# Patient Record
Sex: Female | Born: 1970
Health system: Southern US, Community
[De-identification: ages and names within clinical notes are randomized; demographics above are authoritative.]

## PROBLEM LIST (undated history)

## (undated) DIAGNOSIS — L409 Psoriasis, unspecified: Secondary | ICD-10-CM

## (undated) DIAGNOSIS — I10 Essential (primary) hypertension: Secondary | ICD-10-CM

## (undated) DIAGNOSIS — T7840XA Allergy, unspecified, initial encounter: Secondary | ICD-10-CM

## (undated) DIAGNOSIS — F329 Major depressive disorder, single episode, unspecified: Secondary | ICD-10-CM

## (undated) DIAGNOSIS — F32A Depression, unspecified: Secondary | ICD-10-CM

## (undated) DIAGNOSIS — J301 Allergic rhinitis due to pollen: Secondary | ICD-10-CM

## (undated) DIAGNOSIS — J439 Emphysema, unspecified: Secondary | ICD-10-CM

## (undated) DIAGNOSIS — F419 Anxiety disorder, unspecified: Secondary | ICD-10-CM

## (undated) HISTORY — DX: Emphysema, unspecified: J43.9

## (undated) HISTORY — DX: Anxiety disorder, unspecified: F41.9

## (undated) HISTORY — DX: Depression, unspecified: F32.A

## (undated) HISTORY — PX: WISDOM TOOTH EXTRACTION: SHX21

## (undated) HISTORY — DX: Allergic rhinitis due to pollen: J30.1

## (undated) HISTORY — DX: Psoriasis, unspecified: L40.9

## (undated) HISTORY — DX: Major depressive disorder, single episode, unspecified: F32.9

## (undated) HISTORY — DX: Allergy, unspecified, initial encounter: T78.40XA

## (undated) HISTORY — DX: Essential (primary) hypertension: I10

---
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
## 2001-07-07 ENCOUNTER — Emergency Department (HOSPITAL_COMMUNITY): Admission: EM | Admit: 2001-07-07 | Discharge: 2001-07-07 | Payer: Self-pay | Admitting: Emergency Medicine

## 2001-07-24 ENCOUNTER — Other Ambulatory Visit: Admission: RE | Admit: 2001-07-24 | Discharge: 2001-07-24 | Payer: Self-pay | Admitting: Gynecology

## 2016-02-25 ENCOUNTER — Telehealth: Payer: Self-pay

## 2016-02-25 DIAGNOSIS — Z0120 Encounter for dental examination and cleaning without abnormal findings: Secondary | ICD-10-CM | POA: Diagnosis not present

## 2016-02-25 NOTE — Telephone Encounter (Signed)
Called patient,left message for return call to update chart.

## 2016-02-26 ENCOUNTER — Encounter: Payer: Self-pay | Admitting: Family Medicine

## 2016-02-26 ENCOUNTER — Ambulatory Visit (HOSPITAL_BASED_OUTPATIENT_CLINIC_OR_DEPARTMENT_OTHER)
Admission: RE | Admit: 2016-02-26 | Discharge: 2016-02-26 | Disposition: A | Discharge status: Home / Self Care | Payer: 59 | Source: Ambulatory Visit | Attending: Family Medicine | Admitting: Family Medicine

## 2016-02-26 ENCOUNTER — Ambulatory Visit (INDEPENDENT_AMBULATORY_CARE_PROVIDER_SITE_OTHER): Payer: 59 | Admitting: Family Medicine

## 2016-02-26 VITALS — BP 112/88 | HR 88 | Temp 97.7°F | Ht 66.0 in | Wt 323.4 lb

## 2016-02-26 DIAGNOSIS — Z131 Encounter for screening for diabetes mellitus: Secondary | ICD-10-CM | POA: Diagnosis not present

## 2016-02-26 DIAGNOSIS — G4726 Circadian rhythm sleep disorder, shift work type: Secondary | ICD-10-CM

## 2016-02-26 DIAGNOSIS — Z130 Encounter for screening for diseases of the blood and blood-forming organs and certain disorders involving the immune mechanism: Secondary | ICD-10-CM | POA: Diagnosis not present

## 2016-02-26 DIAGNOSIS — D72829 Elevated white blood cell count, unspecified: Secondary | ICD-10-CM

## 2016-02-26 DIAGNOSIS — M47896 Other spondylosis, lumbar region: Secondary | ICD-10-CM | POA: Diagnosis not present

## 2016-02-26 DIAGNOSIS — E669 Obesity, unspecified: Secondary | ICD-10-CM | POA: Diagnosis not present

## 2016-02-26 DIAGNOSIS — M5441 Lumbago with sciatica, right side: Secondary | ICD-10-CM

## 2016-02-26 DIAGNOSIS — M545 Low back pain: Secondary | ICD-10-CM | POA: Diagnosis present

## 2016-02-26 DIAGNOSIS — M47816 Spondylosis without myelopathy or radiculopathy, lumbar region: Secondary | ICD-10-CM | POA: Diagnosis not present

## 2016-02-26 DIAGNOSIS — Z13220 Encounter for screening for lipoid disorders: Secondary | ICD-10-CM

## 2016-02-26 DIAGNOSIS — Z7689 Persons encountering health services in other specified circumstances: Secondary | ICD-10-CM

## 2016-02-26 DIAGNOSIS — Z7189 Other specified counseling: Secondary | ICD-10-CM

## 2016-02-26 LAB — COMPREHENSIVE METABOLIC PANEL
ALK PHOS: 108 U/L (ref 39–117)
ALT: 21 U/L (ref 0–35)
AST: 16 U/L (ref 0–37)
Albumin: 3.9 g/dL (ref 3.5–5.2)
BILIRUBIN TOTAL: 0.5 mg/dL (ref 0.2–1.2)
BUN: 12 mg/dL (ref 6–23)
CALCIUM: 9.3 mg/dL (ref 8.4–10.5)
CO2: 29 meq/L (ref 19–32)
CREATININE: 0.64 mg/dL (ref 0.40–1.20)
Chloride: 103 mEq/L (ref 96–112)
GFR: 106.49 mL/min (ref >60.00)
GLUCOSE: 114 mg/dL — AB (ref 70–99)
Potassium: 4.3 mEq/L (ref 3.5–5.1)
Sodium: 140 mEq/L (ref 135–145)
TOTAL PROTEIN: 7.5 g/dL (ref 6.0–8.3)

## 2016-02-26 LAB — CBC
HCT: 43.1 % (ref 36.0–46.0)
HEMOGLOBIN: 14.4 g/dL (ref 12.0–15.0)
MCHC: 33.5 g/dL (ref 30.0–36.0)
MCV: 89.3 fl (ref 78.0–100.0)
PLATELETS: 406 10*3/uL — AB (ref 150.0–400.0)
RBC: 4.82 Mil/uL (ref 3.87–5.11)
RDW: 12.8 % (ref 11.5–15.5)
WBC: 12.2 10*3/uL — AB (ref 4.0–10.5)

## 2016-02-26 LAB — HEMOGLOBIN A1C: Hgb A1c MFr Bld: 5.5 % (ref 4.6–6.5)

## 2016-02-26 LAB — LIPID PANEL
CHOL/HDL RATIO: 3
Cholesterol: 168 mg/dL (ref 0–200)
HDL: 52 mg/dL (ref >39.00)
LDL Cholesterol: 99 mg/dL (ref 0–99)
NONHDL: 116.25
TRIGLYCERIDES: 88 mg/dL (ref 0.0–149.0)
VLDL: 17.6 mg/dL (ref 0.0–40.0)

## 2016-02-26 MED ORDER — TEMAZEPAM 30 MG PO CAPS: 30.0000 mg | ORAL_CAPSULE | Freq: Every day | ORAL | Status: DC

## 2016-02-26 MED ORDER — HYDROCODONE-ACETAMINOPHEN 5-325 MG PO TABS: 1.0000 | ORAL_TABLET | Freq: Every evening | ORAL | Status: DC | PRN

## 2016-02-26 MED ORDER — TRAZODONE HCL 50 MG PO TABS: 50.0000 mg | ORAL_TABLET | Freq: Every day | ORAL | Status: DC

## 2016-02-26 NOTE — Progress Notes (Addendum)
[place] at [facility] [address] [place], [place] [address] [phone]  Date:  02/26/2016   Name:  Leslie Wiggins   DOB:  10-Mar-1971   MRN:  [id number]  PCP:  Lamar Blinks, MD    Chief Complaint: New Patient (Initial Visit)   History of Present Illness:  Leslie Wiggins is a 45 y.o. very pleasant female patient who presents with the following:  Recently moved to [facility] from [place] and wishes to establish care with [place] today.  She and her mother moved here together- they live together as her mother is disabled and needs help. The weather was also a major factor in their decision to move.   She lives near [place] and our office is convenient for her  History of obesity, "GI issues" that she controls with diet (she cannot eat fatty foods), back pain Surgical history: wisdom teeth, right ankle fracture that was surgically repaired in her teens  No recent mammo- she would like to get this scheduled.   She does have a history of prolonged menstrual bleeding but this has not happened to her in about 3 years. Her menses are every 1-2 months but this is not unusual for her. 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 She also uses restoril when she has to sleep during the day in preparation for a night shift.    She also notes that she has chronic back and right foot pain for which she uses vicodin once a day.  She did use xanax for stress at her last job but she does not use this much any longer.   She has not had an MRI of her back to investigate her pain.  She has noted the right leg pain for about 4-5 years.  She just started using the narcotic pain medication when she packed to move from [place]- the increased exercise increased her pain.Marland Kitchen She will also have numbness in her middle toes mostly at night.   She also has some urge incontinence-  she has noted this for 9 months or so.  Never had any back surgery or imaging of her back  She is swimming for exercise, but unfortunately her new job is much less active which causes more back pain.  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    She had just some jelly beans so far today.   Patient Active Problem List   Diagnosis Date Noted  . Obesity 02/26/2016    No past medical history on file.  No past surgical history on file.  Social History  Substance Use Topics  . Smoking status: Not on file  . Smokeless tobacco: Not on file  . Alcohol Use: Not on file    No family history on file.  Allergies  Allergen Reactions  . Pollen Extract     Medication list has been reviewed and updated.  No current outpatient prescriptions on file prior to visit.   No current facility-administered medications on file prior to visit.    Review of Systems:  As per HPI- otherwise negative. LMP was 3 weeks ago, no recent intercourse    Physical Examination: Filed Vitals:   02/26/16 0844  BP: 112/88  Pulse:  88  Temp: 97.7 F (36.5 C)   Filed Vitals:   02/26/16 0844  Height: 5\' 6"  (1.676 m)  Weight: 323 lb 6.4 oz (146.693 kg)   Body mass index is 52.22 kg/(m^2). Ideal Body Weight: Weight in (lb) to have BMI = 25: 154.6  GEN: WDWN, NAD, Non-toxic, A & O x 3, morbid obesity HEENT: Atraumatic, Normocephalic. Neck supple. No masses, No LAD. Ears and Nose: No external deformity. CV: RRR, No M/G/R. No JVD. No thrill. No extra heart sounds. PULM: CTA B, no wheezes, crackles, rhonchi. No retractions. No resp. distress. No accessory muscle use. EXTR: No c/c/e NEURO Normal gait.  Normal lumbar flexion and extension. She notes tenderness in the lower lumbar spine to palpation, more so on the right. She notes pain with SLR on the right only. Reduced DTR at bilateral  patella and achilles.  Normal strength of BLE PSYCH: Normally interactive. Conversant. Not depressed or anxious appearing.  Calm demeanor.    Assessment and Plan: Right-sided low back pain with right-sided sciatica - Plan: HYDROcodone-acetaminophen (NORCO) 5-325 MG tablet, DG Lumbar Spine Complete, MR Lumbar Spine Wo Contrast  Obesity  Screening for diabetes mellitus - Plan: Comprehensive metabolic panel, Hemoglobin A1c  Screening for hyperlipidemia - Plan: Lipid panel  Shift work sleep disorder - Plan: traZODone (DESYREL) 50 MG tablet, temazepam (RESTORIL) 30 MG capsule  Screening for deficiency anemia - Plan: CBC  Establishing care with new doctor, encounter for  Will check CMP, A1c to monitor her DM, trazodone and Restoril for shift work sleep issues Did give a limited supply of norco for her to use as needed for severe pain while back evaluation is ongoing.  To have labs and lumbar films today- plan for MRI unless films suggest otherwise   Signed Lamar Blinks, MD  Received x-ray report as below.  Will discuss with pt at the same time as her BW.  Will order MRI for her today Dg Lumbar Spine Complete  02/26/2016  CLINICAL DATA:  Low back pain, right leg pain for 1 month, no known injury EXAM: LUMBAR SPINE - COMPLETE 4+ VIEW COMPARISON:  None. FINDINGS: Five views of the lumbar spine submitted. No acute fracture or subluxation. Minimal disc space flattening with anterior spurring at L1-L2 level. There is moderate disc space flattening with mild anterior spurring and endplate sclerotic changes at L4-L5 level. Mild facet degenerative changes at L5 level. IMPRESSION: No acute fracture or subluxation. Degenerative changes as described above. Electronically Signed   By: Lahoma Crocker M.D.   On: 02/26/2016 09:51   Results for orders placed or performed in visit on 02/26/16  CBC  Result Value Ref Range   WBC 12.2 (H) 4.0 - 10.5 K/uL   RBC 4.82 3.87 - 5.11 Mil/uL   Platelets 406.0 (H)  150.0 - 400.0 K/uL   Hemoglobin 14.4 12.0 - 15.0 g/dL   HCT 43.1 36.0 - 46.0 %   MCV 89.3 78.0 - 100.0 fl   MCHC 33.5 30.0 - 36.0 g/dL   RDW 12.8 11.5 - 15.5 %  Comprehensive metabolic panel  Result Value Ref Range   Sodium 140 135 - 145 mEq/L   Potassium 4.3 3.5 - 5.1 mEq/L   Chloride 103 96 - 112 mEq/L   CO2 29 19 - 32 mEq/L   Glucose, Bld 114 (H) 70 - 99 mg/dL   BUN 12 6 - 23 mg/dL   Creatinine, Ser 0.64 0.40 - 1.20 mg/dL   Total Bilirubin 0.5 0.2 - 1.2 mg/dL  Alkaline Phosphatase 108 39 - 117 U/L   AST 16 0 - 37 U/L   ALT 21 0 - 35 U/L   Total Protein 7.5 6.0 - 8.3 g/dL   Albumin 3.9 3.5 - 5.2 g/dL   Calcium 9.3 8.4 - 10.5 mg/dL   GFR 106.49 >60.00 mL/min  Lipid panel  Result Value Ref Range   Cholesterol 168 0 - 200 mg/dL   Triglycerides 88.0 0.0 - 149.0 mg/dL   HDL 52.00 >39.00 mg/dL   VLDL 17.6 0.0 - 40.0 mg/dL   LDL Cholesterol 99 0 - 99 mg/dL   Total CHOL/HDL Ratio 3    NonHDL 116.25   Hemoglobin A1c  Result Value Ref Range   Hgb A1c MFr Bld 5.5 4.6 - 6.5 %   Called and LMOM- I will order MRI.  Labs are ok, will send a letter   Received her MRI 4/2 as below.  Called and LMOM- she does indeed have a mild disc herniation at L4/5.  This may be impinging on L4.  If she would like, po steroids or a referral to spine/ PM&R may be helpful.  Please let me know her preference and I will mail her a copy of report.  Left her office phone number  Dg Lumbar Spine Complete  02/26/2016  CLINICAL DATA:  Low back pain, right leg pain for 1 month, no known injury EXAM: LUMBAR SPINE - COMPLETE 4+ VIEW COMPARISON:  None. FINDINGS: Five views of the lumbar spine submitted. No acute fracture or subluxation. Minimal disc space flattening with anterior spurring at L1-L2 level. There is moderate disc space flattening with mild anterior spurring and endplate sclerotic changes at L4-L5 level. Mild facet degenerative changes at L5 level. IMPRESSION: No acute fracture or subluxation.  Degenerative changes as described above. Electronically Signed   By: Lahoma Crocker M.D.   On: 02/26/2016 09:51   Mr Lumbar Spine Wo Contrast  03/06/2016  CLINICAL DATA:  Right-sided low back pain and right leg pain, 9 months duration. EXAM: MRI LUMBAR SPINE WITHOUT CONTRAST TECHNIQUE: Multiplanar, multisequence MR imaging of the lumbar spine was performed. No intravenous contrast was administered. COMPARISON:  Radiography 02/26/2016 FINDINGS: No significant finding at L3-4 or above. The discs show mild degenerative desiccation but there is no herniation or stenosis. The distal cord and conus are normal with the conus tip at L1. L4-5: The disc is degenerated with loss of height and there is a shallow herniation with slight caudal down turning in the midline. This indents the thecal sac slightly but does not appear to cause neural compression. There is mild foraminal narrowing on the right without definite compression of the exiting L4 nerve root. L5-S1:  Normal interspace. IMPRESSION: Single level pathology at the L4-5 level. The disc is degenerated with loss of height. There is shallow disc herniation with slight caudal migration. This indents the thecal sac slightly but does not appear to cause gross neural compression. There is mild foraminal stenosis on the right because of osteophyte in disc material. Definite compression of the L4 nerve root is not demonstrated, but neural irritation could occur. Electronically Signed   By: Nelson Chimes M.D.   On: 03/06/2016 17:38

## 2016-02-26 NOTE — Patient Instructions (Signed)
It was very nice to meet you today!  Please do to lab for a blood draw and then downstairs for your films.  I will be in touch with your labs and x-ray report asap We will plan for an MRI once we have your plain films back Please schedule a mammo at your convenience- this can be done at the [facility] if you like [phone] Do work on weight loss- this will help with your back pain

## 2016-02-26 NOTE — Addendum Note (Signed)
Addended by: Lamar Blinks C on: 02/26/2016 01:34 PM   Modules accepted: Orders

## 2016-02-27 MED FILL — HYDROCODON-APAP 5-325: 5-325 | 30 days supply | Qty: 30 | Fill #0

## 2016-02-27 MED FILL — traZODone HCL 50 MG TABS: 50 | 90 days supply | Qty: 90 | Fill #0

## 2016-02-27 MED FILL — TEMAZEPAM 30 MG CAPSULE: 30 | 30 days supply | Qty: 30 | Fill #0

## 2016-03-01 ENCOUNTER — Other Ambulatory Visit: Payer: Self-pay | Admitting: Family Medicine

## 2016-03-01 DIAGNOSIS — Z1231 Encounter for screening mammogram for malignant neoplasm of breast: Secondary | ICD-10-CM

## 2016-03-04 ENCOUNTER — Ambulatory Visit (HOSPITAL_BASED_OUTPATIENT_CLINIC_OR_DEPARTMENT_OTHER): Payer: 59

## 2016-03-06 ENCOUNTER — Ambulatory Visit (HOSPITAL_BASED_OUTPATIENT_CLINIC_OR_DEPARTMENT_OTHER)
Admission: RE | Admit: 2016-03-06 | Discharge: 2016-03-06 | Disposition: A | Discharge status: Home / Self Care | Payer: 59 | Source: Ambulatory Visit | Attending: Family Medicine | Admitting: Family Medicine

## 2016-03-06 DIAGNOSIS — M5126 Other intervertebral disc displacement, lumbar region: Secondary | ICD-10-CM | POA: Diagnosis not present

## 2016-03-06 DIAGNOSIS — M545 Low back pain: Secondary | ICD-10-CM | POA: Diagnosis not present

## 2016-03-06 DIAGNOSIS — M2578 Osteophyte, vertebrae: Secondary | ICD-10-CM | POA: Diagnosis not present

## 2016-03-06 DIAGNOSIS — M5441 Lumbago with sciatica, right side: Secondary | ICD-10-CM | POA: Insufficient documentation

## 2016-03-06 DIAGNOSIS — M4806 Spinal stenosis, lumbar region: Secondary | ICD-10-CM | POA: Diagnosis not present

## 2016-03-06 DIAGNOSIS — M5136 Other intervertebral disc degeneration, lumbar region: Secondary | ICD-10-CM | POA: Diagnosis not present

## 2016-03-07 ENCOUNTER — Encounter: Payer: Self-pay | Admitting: Family Medicine

## 2016-03-08 ENCOUNTER — Telehealth: Payer: Self-pay | Admitting: Family Medicine

## 2016-03-08 DIAGNOSIS — M5136 Other intervertebral disc degeneration, lumbar region: Secondary | ICD-10-CM

## 2016-03-08 DIAGNOSIS — M5126 Other intervertebral disc displacement, lumbar region: Secondary | ICD-10-CM

## 2016-03-08 NOTE — Telephone Encounter (Signed)
Called and spoke with her- she is most interested in a possible epidural steroid injection.  I will refer her to Dr., Nelva Bush or Mina Marble

## 2016-03-08 NOTE — Telephone Encounter (Signed)
Pt called in because she says that she is returning PCP phone call.    CB: [phone]

## 2016-03-09 ENCOUNTER — Ambulatory Visit (HOSPITAL_BASED_OUTPATIENT_CLINIC_OR_DEPARTMENT_OTHER)
Admission: RE | Admit: 2016-03-09 | Discharge: 2016-03-09 | Disposition: A | Discharge status: Home / Self Care | Payer: 59 | Source: Ambulatory Visit | Attending: Family Medicine | Admitting: Family Medicine

## 2016-03-09 DIAGNOSIS — Z1231 Encounter for screening mammogram for malignant neoplasm of breast: Secondary | ICD-10-CM | POA: Insufficient documentation

## 2016-03-15 ENCOUNTER — Ambulatory Visit (INDEPENDENT_AMBULATORY_CARE_PROVIDER_SITE_OTHER): Payer: 59 | Admitting: Family Medicine

## 2016-03-15 ENCOUNTER — Encounter: Payer: Self-pay | Admitting: Family Medicine

## 2016-03-15 ENCOUNTER — Other Ambulatory Visit (HOSPITAL_COMMUNITY)
Admission: RE | Admit: 2016-03-15 | Discharge: 2016-03-15 | Disposition: A | Discharge status: Home / Self Care | Payer: 59 | Source: Ambulatory Visit | Attending: Family Medicine | Admitting: Family Medicine

## 2016-03-15 VITALS — BP 122/84 | HR 100 | Temp 98.0°F | Ht 66.0 in | Wt 322.8 lb

## 2016-03-15 DIAGNOSIS — Z01419 Encounter for gynecological examination (general) (routine) without abnormal findings: Secondary | ICD-10-CM | POA: Insufficient documentation

## 2016-03-15 DIAGNOSIS — D72829 Elevated white blood cell count, unspecified: Secondary | ICD-10-CM

## 2016-03-15 DIAGNOSIS — Z1151 Encounter for screening for human papillomavirus (HPV): Secondary | ICD-10-CM | POA: Insufficient documentation

## 2016-03-15 DIAGNOSIS — Z124 Encounter for screening for malignant neoplasm of cervix: Secondary | ICD-10-CM

## 2016-03-15 NOTE — Progress Notes (Signed)
[place] at [facility] [address], [place], [place] [address] [phone]  Date:  03/15/2016   Name:  Leslie Wiggins   DOB:  February 27, 1971   MRN:  [id number]  PCP:  Lamar Blinks, MD    Chief Complaint: Follow-up   History of Present Illness:  Leslie Wiggins is a 45 y.o. very pleasant female patient who presents with the following:  Seen by myself about 2 weeks ago with right sided lower back pain.  We did an MRI and I have referred her to ortho/ PM and R to look at this further.    Her appt with Dr. Nelva Bush is coming up Recent normal mammogram- she does have a family history of breast cancer.   She is here today seeking a pap- last pap was last year- 12/2014.  Never had an abnormal pap. She does notice a bump on her vulva that has been there for 10 years or so.  LMP: 01/21/2016 She is not SA so there is no chance of pregnancy or STI She does not have any children  Mild leukocytosis at her last visit that she would like to recheck today- this is fine  Patient Active Problem List   Diagnosis Date Noted  . Obesity 02/26/2016    Past Medical History  Diagnosis Date  . Depression   . Hay fever     Past Surgical History  Procedure Laterality Date  . [place] tooth extraction  1988    Social History  Substance Use Topics  . Smoking status: Never Smoker   . Smokeless tobacco: Never Used  . Alcohol Use: No    Family History  Problem Relation Age of Onset  . Breast cancer Mother   . Hypertension Mother   . Hypertension Father   . Tongue cancer Father     Allergies  Allergen Reactions  . Pollen Extract     Medication list has been reviewed and updated.  Current Outpatient Prescriptions on File Prior to Visit  Medication Sig Dispense Refill  . ALPRAZolam (XANAX) 0.25 MG tablet Take 0.25 mg by mouth 2 (two) times daily.    Marland Kitchen HYDROcodone-acetaminophen (NORCO) 5-325 MG tablet Take 1 tablet by mouth at bedtime as  needed for moderate pain. 30 tablet 0  . temazepam (RESTORIL) 30 MG capsule Take 1 capsule (30 mg total) by mouth at bedtime. 30 capsule 1  . traZODone (DESYREL) 50 MG tablet Take 1 tablet (50 mg total) by mouth at bedtime. 90 tablet 1   No current facility-administered medications on file prior to visit.    Review of Systems:  As per HPI- otherwise negative.   Physical Examination: Filed Vitals:   03/15/16 1634  BP: 122/84  Pulse: 100  Temp: 98 F (36.7 C)   Filed Vitals:   03/15/16 1634  Height: 5\' 6"  (1.676 m)  Weight: 322 lb 12.8 oz (146.421 kg)   Body mass index is 52.13 kg/(m^2). Ideal Body Weight: Weight in (lb) to have BMI = 25: 154.6  GEN: WDWN, NAD, Non-toxic, A & O x 3, quite obese, looks well HEENT: Atraumatic, Normocephalic. Neck supple. No masses, No LAD. Ears and Nose: No external deformity. CV: RRR, No M/G/R. No JVD. No thrill. No extra heart sounds. PULM: CTA B, no wheezes, crackles, rhonchi. No retractions. No resp. distress. No accessory muscle use. ABD: S, NT, ND EXTR: No c/c/e NEURO Normal gait.  PSYCH: Normally interactive. Conversant. Not depressed or anxious appearing.  Calm demeanor.  Pelvic: normal, no vaginal lesions or discharge. Uterus normal, no CMT, no adnexal tendereness or masses She has a small blocked oil duct on the right labia that I was able to express for her  Assessment and Plan: Screening for cervical cancer - Plan: Cytology - PAP  Leukocytosis - Plan: CBC with Differential/Platelet  Pap and recheck CBC today  Signed Lamar Blinks, MD

## 2016-03-15 NOTE — Progress Notes (Signed)
Pre visit review using our clinic review tool, if applicable. No additional management support is needed unless otherwise documented below in the visit note. 

## 2016-03-15 NOTE — Patient Instructions (Signed)
We will repeat your blood count today I will be in touch with your pap results asap  Take care!

## 2016-03-16 LAB — CBC WITH DIFFERENTIAL/PLATELET
BASOS PCT: 0.6 % (ref 0.0–3.0)
Basophils Absolute: 0.1 10*3/uL (ref 0.0–0.1)
EOS ABS: 0.1 10*3/uL (ref 0.0–0.7)
Eosinophils Relative: 0.9 % (ref 0.0–5.0)
HCT: 41.8 % (ref 36.0–46.0)
HEMOGLOBIN: 14.4 g/dL (ref 12.0–15.0)
LYMPHS ABS: 2.6 10*3/uL (ref 0.7–4.0)
Lymphocytes Relative: 17.4 % (ref 12.0–46.0)
MCHC: 34.5 g/dL (ref 30.0–36.0)
MCV: 87.3 fl (ref 78.0–100.0)
MONO ABS: 0.6 10*3/uL (ref 0.1–1.0)
Monocytes Relative: 4 % (ref 3.0–12.0)
NEUTROS PCT: 77.1 % — AB (ref 43.0–77.0)
Neutro Abs: 11.3 10*3/uL — ABNORMAL HIGH (ref 1.4–7.7)
Platelets: 444 10*3/uL — ABNORMAL HIGH (ref 150.0–400.0)
RBC: 4.79 Mil/uL (ref 3.87–5.11)
RDW: 12.3 % (ref 11.5–15.5)
WBC: 14.7 10*3/uL — ABNORMAL HIGH (ref 4.0–10.5)

## 2016-03-17 LAB — CYTOLOGY - PAP

## 2016-03-20 ENCOUNTER — Encounter: Payer: Self-pay | Admitting: Family Medicine

## 2016-03-22 ENCOUNTER — Telehealth: Payer: Self-pay | Admitting: Family Medicine

## 2016-03-22 DIAGNOSIS — D72829 Elevated white blood cell count, unspecified: Secondary | ICD-10-CM

## 2016-03-22 NOTE — Telephone Encounter (Signed)
Called and LMOM- Received her pap- it is normal. However her WBC count is still high- it is a bit worse than it was before.  I will place an order- please come in for a lab visit only in 2-3 weeks for a CBC.  If this abnl persists will refer to hematology. If she has any symptoms such as fever please let me know.

## 2016-03-29 ENCOUNTER — Telehealth: Payer: Self-pay | Admitting: Family Medicine

## 2016-03-29 DIAGNOSIS — M5441 Lumbago with sciatica, right side: Secondary | ICD-10-CM

## 2016-03-29 MED ORDER — HYDROCODONE-ACETAMINOPHEN 5-325 MG PO TABS: 1.0000 | ORAL_TABLET | Freq: Every evening | ORAL | Status: DC | PRN

## 2016-03-29 NOTE — Telephone Encounter (Signed)
Relation to [id number] Call back [place]:  Reason for call:  Patient requesting a 2 week supply of  HYDROcodone-acetaminophen (NORCO) 5-325 MG tablet states she's getting an epidural on 04/13/16. Please advise

## 2016-03-29 NOTE — Telephone Encounter (Signed)
Patient would like to pick up rx tomorrow morning if possible.

## 2016-03-30 MED FILL — HYDROCODON-APAP 5-325: 5-325 | 30 days supply | Qty: 30 | Fill #0

## 2016-04-07 ENCOUNTER — Encounter: Payer: Self-pay | Admitting: Family Medicine

## 2016-04-13 DIAGNOSIS — M5442 Lumbago with sciatica, left side: Secondary | ICD-10-CM | POA: Diagnosis not present

## 2016-04-13 MED FILL — TEMAZEPAM 30 MG CAPSULE: 30 | 30 days supply | Qty: 30 | Fill #1

## 2016-04-27 DIAGNOSIS — M5442 Lumbago with sciatica, left side: Secondary | ICD-10-CM | POA: Diagnosis not present

## 2016-06-03 ENCOUNTER — Other Ambulatory Visit: Payer: Self-pay | Admitting: Emergency Medicine

## 2016-06-03 ENCOUNTER — Other Ambulatory Visit: Payer: Self-pay | Admitting: Family Medicine

## 2016-06-03 NOTE — Telephone Encounter (Signed)
Relation to [id number] Call back [place]: [place], [place] [phone] (Phone) [phone] (Fax         Reason for call:  Patient requesting a refill ALPRAZolam (XANAX) 0.25 MG tablet

## 2016-06-04 MED ORDER — ALPRAZOLAM 0.25 MG PO TABS: 0.2500 mg | ORAL_TABLET | Freq: Two times a day (BID) | ORAL | Status: DC

## 2016-06-04 MED FILL — ALPRAZolam 0.25 MG TABS: 0.25 | 30 days supply | Qty: 30 | Fill #0

## 2016-06-07 ENCOUNTER — Other Ambulatory Visit: Payer: Self-pay | Admitting: Family Medicine

## 2016-06-07 DIAGNOSIS — M25551 Pain in right hip: Secondary | ICD-10-CM | POA: Diagnosis not present

## 2016-06-07 DIAGNOSIS — M545 Low back pain: Secondary | ICD-10-CM | POA: Diagnosis not present

## 2016-06-07 DIAGNOSIS — M25552 Pain in left hip: Secondary | ICD-10-CM | POA: Diagnosis not present

## 2016-06-07 DIAGNOSIS — M79604 Pain in right leg: Secondary | ICD-10-CM | POA: Diagnosis not present

## 2016-06-07 DIAGNOSIS — M25559 Pain in unspecified hip: Secondary | ICD-10-CM | POA: Diagnosis not present

## 2016-06-07 MED FILL — traZODone HCL 50 MG TABS: 50 | 90 days supply | Qty: 90 | Fill #1

## 2016-06-09 MED FILL — TEMAZEPAM 30 MG CAPSULE: 30 | 30 days supply | Qty: 30 | Fill #0

## 2016-06-14 DIAGNOSIS — M25552 Pain in left hip: Secondary | ICD-10-CM | POA: Diagnosis not present

## 2016-06-14 DIAGNOSIS — M545 Low back pain: Secondary | ICD-10-CM | POA: Diagnosis not present

## 2016-06-14 DIAGNOSIS — M79604 Pain in right leg: Secondary | ICD-10-CM | POA: Diagnosis not present

## 2016-06-14 DIAGNOSIS — M25559 Pain in unspecified hip: Secondary | ICD-10-CM | POA: Diagnosis not present

## 2016-06-14 DIAGNOSIS — M25551 Pain in right hip: Secondary | ICD-10-CM | POA: Diagnosis not present

## 2016-06-21 DIAGNOSIS — M25552 Pain in left hip: Secondary | ICD-10-CM | POA: Diagnosis not present

## 2016-06-21 DIAGNOSIS — M545 Low back pain: Secondary | ICD-10-CM | POA: Diagnosis not present

## 2016-06-21 DIAGNOSIS — M25551 Pain in right hip: Secondary | ICD-10-CM | POA: Diagnosis not present

## 2016-06-21 DIAGNOSIS — M79604 Pain in right leg: Secondary | ICD-10-CM | POA: Diagnosis not present

## 2016-06-21 DIAGNOSIS — M25559 Pain in unspecified hip: Secondary | ICD-10-CM | POA: Diagnosis not present

## 2016-06-28 DIAGNOSIS — M25552 Pain in left hip: Secondary | ICD-10-CM | POA: Diagnosis not present

## 2016-06-28 DIAGNOSIS — M79604 Pain in right leg: Secondary | ICD-10-CM | POA: Diagnosis not present

## 2016-06-28 DIAGNOSIS — M25551 Pain in right hip: Secondary | ICD-10-CM | POA: Diagnosis not present

## 2016-06-28 DIAGNOSIS — M545 Low back pain: Secondary | ICD-10-CM | POA: Diagnosis not present

## 2016-06-28 DIAGNOSIS — M25559 Pain in unspecified hip: Secondary | ICD-10-CM | POA: Diagnosis not present

## 2016-07-05 DIAGNOSIS — M25559 Pain in unspecified hip: Secondary | ICD-10-CM | POA: Diagnosis not present

## 2016-07-05 DIAGNOSIS — M25551 Pain in right hip: Secondary | ICD-10-CM | POA: Diagnosis not present

## 2016-07-05 DIAGNOSIS — M545 Low back pain: Secondary | ICD-10-CM | POA: Diagnosis not present

## 2016-07-05 DIAGNOSIS — M79604 Pain in right leg: Secondary | ICD-10-CM | POA: Diagnosis not present

## 2016-07-05 DIAGNOSIS — M25552 Pain in left hip: Secondary | ICD-10-CM | POA: Diagnosis not present

## 2016-07-13 DIAGNOSIS — M79604 Pain in right leg: Secondary | ICD-10-CM | POA: Diagnosis not present

## 2016-07-13 DIAGNOSIS — M25551 Pain in right hip: Secondary | ICD-10-CM | POA: Diagnosis not present

## 2016-07-13 DIAGNOSIS — M25559 Pain in unspecified hip: Secondary | ICD-10-CM | POA: Diagnosis not present

## 2016-07-13 DIAGNOSIS — M545 Low back pain: Secondary | ICD-10-CM | POA: Diagnosis not present

## 2016-07-13 DIAGNOSIS — M25552 Pain in left hip: Secondary | ICD-10-CM | POA: Diagnosis not present

## 2016-07-22 ENCOUNTER — Other Ambulatory Visit: Payer: Self-pay | Admitting: Family

## 2016-07-22 MED ORDER — TEMAZEPAM 30 MG PO CAPS: 30.0000 mg | ORAL_CAPSULE | Freq: Every day | ORAL | 1 refills | Status: DC

## 2016-07-22 MED FILL — TEMAZEPAM 30 MG CAPSULE: 30 | 30 days supply | Qty: 30 | Fill #0

## 2016-07-27 DIAGNOSIS — M545 Low back pain: Secondary | ICD-10-CM | POA: Diagnosis not present

## 2016-07-27 DIAGNOSIS — M79604 Pain in right leg: Secondary | ICD-10-CM | POA: Diagnosis not present

## 2016-07-27 DIAGNOSIS — M25552 Pain in left hip: Secondary | ICD-10-CM | POA: Diagnosis not present

## 2016-07-27 DIAGNOSIS — M25551 Pain in right hip: Secondary | ICD-10-CM | POA: Diagnosis not present

## 2016-07-27 DIAGNOSIS — M25559 Pain in unspecified hip: Secondary | ICD-10-CM | POA: Diagnosis not present

## 2016-08-12 DIAGNOSIS — M25552 Pain in left hip: Secondary | ICD-10-CM | POA: Diagnosis not present

## 2016-08-12 DIAGNOSIS — M545 Low back pain: Secondary | ICD-10-CM | POA: Diagnosis not present

## 2016-08-12 DIAGNOSIS — M79604 Pain in right leg: Secondary | ICD-10-CM | POA: Diagnosis not present

## 2016-08-12 DIAGNOSIS — M25551 Pain in right hip: Secondary | ICD-10-CM | POA: Diagnosis not present

## 2016-08-12 DIAGNOSIS — M25559 Pain in unspecified hip: Secondary | ICD-10-CM | POA: Diagnosis not present

## 2016-08-23 DIAGNOSIS — M25552 Pain in left hip: Secondary | ICD-10-CM | POA: Diagnosis not present

## 2016-08-23 DIAGNOSIS — M25551 Pain in right hip: Secondary | ICD-10-CM | POA: Diagnosis not present

## 2016-08-23 DIAGNOSIS — M25559 Pain in unspecified hip: Secondary | ICD-10-CM | POA: Diagnosis not present

## 2016-08-23 DIAGNOSIS — M545 Low back pain: Secondary | ICD-10-CM | POA: Diagnosis not present

## 2016-08-23 DIAGNOSIS — M79604 Pain in right leg: Secondary | ICD-10-CM | POA: Diagnosis not present

## 2016-09-02 MED FILL — TEMAZEPAM 30 MG CAPSULE: 30 | 30 days supply | Qty: 30 | Fill #1

## 2016-09-08 DIAGNOSIS — M545 Low back pain: Secondary | ICD-10-CM | POA: Diagnosis not present

## 2016-09-08 DIAGNOSIS — M25559 Pain in unspecified hip: Secondary | ICD-10-CM | POA: Diagnosis not present

## 2016-09-08 DIAGNOSIS — M79604 Pain in right leg: Secondary | ICD-10-CM | POA: Diagnosis not present

## 2016-09-08 DIAGNOSIS — M25551 Pain in right hip: Secondary | ICD-10-CM | POA: Diagnosis not present

## 2016-09-08 DIAGNOSIS — M25552 Pain in left hip: Secondary | ICD-10-CM | POA: Diagnosis not present

## 2016-09-15 DIAGNOSIS — M545 Low back pain: Secondary | ICD-10-CM | POA: Diagnosis not present

## 2016-09-15 DIAGNOSIS — M25551 Pain in right hip: Secondary | ICD-10-CM | POA: Diagnosis not present

## 2016-09-15 DIAGNOSIS — M79604 Pain in right leg: Secondary | ICD-10-CM | POA: Diagnosis not present

## 2016-09-15 DIAGNOSIS — M25559 Pain in unspecified hip: Secondary | ICD-10-CM | POA: Diagnosis not present

## 2016-09-15 DIAGNOSIS — M25552 Pain in left hip: Secondary | ICD-10-CM | POA: Diagnosis not present

## 2016-09-27 DIAGNOSIS — M25559 Pain in unspecified hip: Secondary | ICD-10-CM | POA: Diagnosis not present

## 2016-09-27 DIAGNOSIS — M25551 Pain in right hip: Secondary | ICD-10-CM | POA: Diagnosis not present

## 2016-09-27 DIAGNOSIS — M545 Low back pain: Secondary | ICD-10-CM | POA: Diagnosis not present

## 2016-09-27 DIAGNOSIS — M25552 Pain in left hip: Secondary | ICD-10-CM | POA: Diagnosis not present

## 2016-09-27 DIAGNOSIS — M79604 Pain in right leg: Secondary | ICD-10-CM | POA: Diagnosis not present

## 2016-10-01 ENCOUNTER — Other Ambulatory Visit: Payer: Self-pay | Admitting: Family Medicine

## 2016-10-01 DIAGNOSIS — G4726 Circadian rhythm sleep disorder, shift work type: Secondary | ICD-10-CM

## 2016-10-04 DIAGNOSIS — M545 Low back pain: Secondary | ICD-10-CM | POA: Diagnosis not present

## 2016-10-04 DIAGNOSIS — M25559 Pain in unspecified hip: Secondary | ICD-10-CM | POA: Diagnosis not present

## 2016-10-04 DIAGNOSIS — M546 Pain in thoracic spine: Secondary | ICD-10-CM | POA: Diagnosis not present

## 2016-10-04 MED FILL — traZODone HCL 50 MG TABS: 50 | 90 days supply | Qty: 90 | Fill #0

## 2016-10-11 ENCOUNTER — Other Ambulatory Visit: Payer: Self-pay | Admitting: Family Medicine

## 2016-10-11 DIAGNOSIS — M545 Low back pain: Secondary | ICD-10-CM | POA: Diagnosis not present

## 2016-10-11 DIAGNOSIS — M25559 Pain in unspecified hip: Secondary | ICD-10-CM | POA: Diagnosis not present

## 2016-10-11 DIAGNOSIS — M546 Pain in thoracic spine: Secondary | ICD-10-CM | POA: Diagnosis not present

## 2016-10-11 NOTE — Telephone Encounter (Signed)
Received refill request for TEMAZEPAM 30 MG CAPSULE 30 MG CAP. Last refill 07/22/16 and last office visit 03/15/16. Is it ok to refill? Please advise.

## 2016-10-12 MED FILL — TEMAZEPAM 30 MG CAPSULE: 30 | 30 days supply | Qty: 30 | Fill #0

## 2016-10-18 DIAGNOSIS — M545 Low back pain: Secondary | ICD-10-CM | POA: Diagnosis not present

## 2016-10-18 DIAGNOSIS — M546 Pain in thoracic spine: Secondary | ICD-10-CM | POA: Diagnosis not present

## 2016-10-18 DIAGNOSIS — M25559 Pain in unspecified hip: Secondary | ICD-10-CM | POA: Diagnosis not present

## 2016-11-01 DIAGNOSIS — M546 Pain in thoracic spine: Secondary | ICD-10-CM | POA: Diagnosis not present

## 2016-11-01 DIAGNOSIS — M25559 Pain in unspecified hip: Secondary | ICD-10-CM | POA: Diagnosis not present

## 2016-11-01 DIAGNOSIS — M545 Low back pain: Secondary | ICD-10-CM | POA: Diagnosis not present

## 2016-11-08 DIAGNOSIS — M25559 Pain in unspecified hip: Secondary | ICD-10-CM | POA: Diagnosis not present

## 2016-11-08 DIAGNOSIS — M545 Low back pain: Secondary | ICD-10-CM | POA: Diagnosis not present

## 2016-11-08 DIAGNOSIS — M546 Pain in thoracic spine: Secondary | ICD-10-CM | POA: Diagnosis not present

## 2016-11-11 MED FILL — TEMAZEPAM 30 MG CAPSULE: 30 | 30 days supply | Qty: 30 | Fill #1

## 2016-11-17 DIAGNOSIS — M25559 Pain in unspecified hip: Secondary | ICD-10-CM | POA: Diagnosis not present

## 2016-11-17 DIAGNOSIS — M545 Low back pain: Secondary | ICD-10-CM | POA: Diagnosis not present

## 2016-11-17 DIAGNOSIS — M546 Pain in thoracic spine: Secondary | ICD-10-CM | POA: Diagnosis not present

## 2016-11-22 DIAGNOSIS — M546 Pain in thoracic spine: Secondary | ICD-10-CM | POA: Diagnosis not present

## 2016-11-22 DIAGNOSIS — M530 Cervicocranial syndrome: Secondary | ICD-10-CM | POA: Diagnosis not present

## 2016-11-22 DIAGNOSIS — M545 Low back pain: Secondary | ICD-10-CM | POA: Diagnosis not present

## 2016-12-01 DIAGNOSIS — M545 Low back pain: Secondary | ICD-10-CM | POA: Diagnosis not present

## 2016-12-01 DIAGNOSIS — M530 Cervicocranial syndrome: Secondary | ICD-10-CM | POA: Diagnosis not present

## 2016-12-01 DIAGNOSIS — M546 Pain in thoracic spine: Secondary | ICD-10-CM | POA: Diagnosis not present

## 2016-12-08 DIAGNOSIS — M545 Low back pain: Secondary | ICD-10-CM | POA: Diagnosis not present

## 2016-12-08 DIAGNOSIS — M546 Pain in thoracic spine: Secondary | ICD-10-CM | POA: Diagnosis not present

## 2016-12-08 DIAGNOSIS — M530 Cervicocranial syndrome: Secondary | ICD-10-CM | POA: Diagnosis not present

## 2016-12-20 ENCOUNTER — Other Ambulatory Visit: Payer: Self-pay | Admitting: Family Medicine

## 2016-12-21 ENCOUNTER — Encounter: Payer: Self-pay | Admitting: Family Medicine

## 2016-12-21 ENCOUNTER — Other Ambulatory Visit: Payer: Self-pay | Admitting: Emergency Medicine

## 2016-12-21 MED ORDER — TEMAZEPAM 30 MG PO CAPS: ORAL_CAPSULE | ORAL | 1 refills | Status: DC

## 2016-12-21 MED FILL — TEMAZEPAM 30 MG CAPSULE: 30 | 30 days supply | Qty: 30 | Fill #0

## 2016-12-21 NOTE — Telephone Encounter (Signed)
Received refill request for TEMAZEPAM 30 MG CAPSULE 30 CAP. Last office visit 03/15/16 and last refill 10/11/16. Is it ok to refill? Please advise.

## 2016-12-29 DIAGNOSIS — M530 Cervicocranial syndrome: Secondary | ICD-10-CM | POA: Diagnosis not present

## 2016-12-29 DIAGNOSIS — M546 Pain in thoracic spine: Secondary | ICD-10-CM | POA: Diagnosis not present

## 2016-12-29 DIAGNOSIS — M545 Low back pain: Secondary | ICD-10-CM | POA: Diagnosis not present

## 2017-01-07 MED FILL — traZODone HCL 50 MG TABS: 50 | 90 days supply | Qty: 90 | Fill #1

## 2017-01-11 DIAGNOSIS — M545 Low back pain: Secondary | ICD-10-CM | POA: Diagnosis not present

## 2017-01-11 DIAGNOSIS — M546 Pain in thoracic spine: Secondary | ICD-10-CM | POA: Diagnosis not present

## 2017-01-11 DIAGNOSIS — M530 Cervicocranial syndrome: Secondary | ICD-10-CM | POA: Diagnosis not present

## 2017-01-23 ENCOUNTER — Telehealth: Payer: Self-pay | Admitting: Family Medicine

## 2017-01-24 ENCOUNTER — Encounter: Payer: Self-pay | Admitting: Family Medicine

## 2017-01-24 MED FILL — TEMAZEPAM 30 MG CAPSULE: 30 | 30 days supply | Qty: 30 | Fill #1

## 2017-01-24 NOTE — Telephone Encounter (Signed)
Pt is requesting refill on Alprazolam 

## 2017-01-24 NOTE — Telephone Encounter (Signed)
Last visit with myself in April of last year [place]- she has been getting temazepam on a regular basis over the last 4 months.  Will send her a mychart message- does she want alprazolam or temazepam?

## 2017-01-25 MED ORDER — ALPRAZOLAM 0.25 MG PO TABS: ORAL_TABLET | ORAL | 0 refills | Status: DC

## 2017-01-25 MED FILL — ALPRAZolam 0.25 MG TABS: 0.25 | 30 days supply | Qty: 30 | Fill #0

## 2017-02-09 ENCOUNTER — Ambulatory Visit (INDEPENDENT_AMBULATORY_CARE_PROVIDER_SITE_OTHER): Payer: 59 | Admitting: Family Medicine

## 2017-02-09 ENCOUNTER — Encounter: Payer: Self-pay | Admitting: Family Medicine

## 2017-02-09 VITALS — BP 122/84 | HR 94 | Temp 97.8°F | Ht 66.0 in | Wt 326.2 lb

## 2017-02-09 DIAGNOSIS — E669 Obesity, unspecified: Secondary | ICD-10-CM | POA: Diagnosis not present

## 2017-02-09 DIAGNOSIS — Z1329 Encounter for screening for other suspected endocrine disorder: Secondary | ICD-10-CM

## 2017-02-09 DIAGNOSIS — D72829 Elevated white blood cell count, unspecified: Secondary | ICD-10-CM | POA: Diagnosis not present

## 2017-02-09 DIAGNOSIS — Z0000 Encounter for general adult medical examination without abnormal findings: Secondary | ICD-10-CM

## 2017-02-09 DIAGNOSIS — Z131 Encounter for screening for diabetes mellitus: Secondary | ICD-10-CM

## 2017-02-09 DIAGNOSIS — Z13220 Encounter for screening for lipoid disorders: Secondary | ICD-10-CM | POA: Diagnosis not present

## 2017-02-09 LAB — CBC
HCT: 41.8 % (ref 36.0–46.0)
HEMOGLOBIN: 13.7 g/dL (ref 12.0–15.0)
MCHC: 32.9 g/dL (ref 30.0–36.0)
MCV: 89.9 fl (ref 78.0–100.0)
PLATELETS: 404 10*3/uL — AB (ref 150.0–400.0)
RBC: 4.65 Mil/uL (ref 3.87–5.11)
RDW: 13.1 % (ref 11.5–15.5)
WBC: 13.9 10*3/uL — ABNORMAL HIGH (ref 4.0–10.5)

## 2017-02-09 LAB — LIPID PANEL
Cholesterol: 162 mg/dL (ref 0–200)
HDL: 47.9 mg/dL (ref >39.00)
LDL Cholesterol: 92 mg/dL (ref 0–99)
NonHDL: 114.41
TRIGLYCERIDES: 112 mg/dL (ref 0.0–149.0)
Total CHOL/HDL Ratio: 3
VLDL: 22.4 mg/dL (ref 0.0–40.0)

## 2017-02-09 LAB — COMPREHENSIVE METABOLIC PANEL
ALBUMIN: 3.8 g/dL (ref 3.5–5.2)
ALT: 18 U/L (ref 0–35)
AST: 14 U/L (ref 0–37)
Alkaline Phosphatase: 109 U/L (ref 39–117)
BILIRUBIN TOTAL: 0.5 mg/dL (ref 0.2–1.2)
BUN: 8 mg/dL (ref 6–23)
CALCIUM: 9.3 mg/dL (ref 8.4–10.5)
CO2: 30 meq/L (ref 19–32)
CREATININE: 0.58 mg/dL (ref 0.40–1.20)
Chloride: 103 mEq/L (ref 96–112)
GFR: 118.8 mL/min (ref >60.00)
Glucose, Bld: 90 mg/dL (ref 70–99)
Potassium: 4.3 mEq/L (ref 3.5–5.1)
Sodium: 138 mEq/L (ref 135–145)
TOTAL PROTEIN: 6.9 g/dL (ref 6.0–8.3)

## 2017-02-09 LAB — HEMOGLOBIN A1C: Hgb A1c MFr Bld: 5.5 % (ref 4.6–6.5)

## 2017-02-09 LAB — TSH: TSH: 1.34 u[IU]/mL (ref 0.35–4.50)

## 2017-02-09 NOTE — Progress Notes (Signed)
Pre visit review using our clinic review tool, if applicable. No additional management support is needed unless otherwise documented below in the visit note. 

## 2017-02-09 NOTE — Addendum Note (Signed)
Addended by: Lamar Blinks C on: 02/09/2017 07:12 PM   Modules accepted: Orders

## 2017-02-09 NOTE — Progress Notes (Addendum)
[place] at [facility] [address] [place], [place] [address] [phone]  Date:  02/09/2017   Name:  Leslie Wiggins   DOB:  01/11/1971   MRN:  [id number]  PCP:  Lamar Blinks, MD    Chief Complaint: Annual Exam (Pt here for CPE. )   History of Present Illness:  Leslie Wiggins is a 46 y.o. very pleasant female patient who presents with the following:  History of obesity and depression., here today for a CPE Last pap: 4/17- negative Mammo: 4/17, normal  Last labs a year ago- due today.  She is fasting today  Wt Readings from Last 3 Encounters:  02/09/17 (!) 326 lb 3.2 oz (148 kg)  03/15/16 (!) 322 lb 12.8 oz (146.4 kg)  02/26/16 (!) 323 lb 6.4 oz (146.7 kg)    BP Readings from Last 3 Encounters:  02/09/17 122/84  03/15/16 122/84  02/26/16 112/88   She is sleeping pretty well- she does do night shift work, and she will use restoril and trazodone only on days that she has to work the next day.  She will not use it when she is off the next day. Overall she feels like this regimen is working well for her and allowing her to get enough rest She does not have history of OSA and does not snore per her knowledge   Her mood is "fine," unless her sister is coming to visit.  She sets limits and does not let her stay with her too long.    Her mother lives in her home with her- she is handicapped and needs assistance.    Patient Active Problem List   Diagnosis Date Noted  . Obesity 02/26/2016    Past Medical History:  Diagnosis Date  . Depression   . Hay fever     Past Surgical History:  Procedure Laterality Date  . [place] EXTRACTION  1988    Social History  Substance Use Topics  . Smoking status: Never Smoker  . Smokeless tobacco: Never Used  . Alcohol use No    Family History  Problem Relation Age of Onset  . Breast cancer Mother   . Hypertension Mother   . Hypertension Father   . Tongue cancer  Father     Allergies  Allergen Reactions  . Pollen Extract   . Tape Dermatitis, Itching and Rash    Medication list has been reviewed and updated.  Current Outpatient Prescriptions on File Prior to Visit  Medication Sig Dispense Refill  . ALPRAZolam (XANAX) 0.25 MG tablet Take 1/2 or 1 once daily as needed for anxiety 30 tablet 0  . HYDROcodone-acetaminophen (NORCO) 5-325 MG tablet Take 1 tablet by mouth at bedtime as needed for moderate pain. 30 tablet 0  . temazepam (RESTORIL) 30 MG capsule TAKE 1 CAPSULE BY MOUTH DAILY AT BEDTIME AS NEEDED FOR SLEEP 30 capsule 1  . traZODone (DESYREL) 50 MG tablet TAKE 1 TABLET BY MOUTH AT BEDTIME 90 tablet 1   No current facility-administered medications on file prior to visit.     Review of Systems: She has noted some mild tingling in her right hand after typing a lot No concerns about breast changes She will occasionally get some small, itchy blisters on her fingers but she has gotten these for years  As per HPI- otherwise negative.'  Physical Examination: Vitals:   02/09/17 1406  BP: 122/84  Pulse: 94  Temp: 97.8 F (36.6 C)  Vitals:   02/09/17 1406  Weight: (!) 326 lb 3.2 oz (148 kg)  Height: 5\' 6"  (1.676 m)   Body mass index is 52.65 kg/m. Ideal Body Weight: Weight in (lb) to have BMI = 25: 154.6  GEN: WDWN, NAD, Non-toxic, A & O x 3, obese, OW looks well HEENT: Atraumatic, Normocephalic. Neck supple. No masses, No LAD.  Bilateral TM wnl, oropharynx normal.  PEERL,EOMI.   Ears and Nose: No external deformity. CV: RRR, No M/G/R. No JVD. No thrill. No extra heart sounds. PULM: CTA B, no wheezes, crackles, rhonchi. No retractions. No resp. distress. No accessory muscle use. ABD: S, NT, ND, +BS. No rebound. No HSM. EXTR: No c/c/e NEURO Normal gait.  PSYCH: Normally interactive. Conversant. Not depressed or anxious appearing.  Calm demeanor.    Assessment and Plan: Physical exam  Leukocytosis, unspecified type - Plan:  CBC  Screening for hyperlipidemia - Plan: Lipid panel  Screening for diabetes mellitus - Plan: Comprehensive metabolic panel, Hemoglobin A1c  Screening for thyroid disorder - Plan: TSH  Obesity without serious comorbidity, unspecified classification, unspecified obesity type  Here today for a CPE Labs pending as above Noted to have leukocytosis in the past- repeat today Will plan further follow- up pending labs.   Signed Lamar Blinks, MD  Results for orders placed or performed in visit on 02/09/17  CBC  Result Value Ref Range   WBC 13.9 (H) 4.0 - 10.5 K/uL   RBC 4.65 3.87 - 5.11 Mil/uL   Platelets 404.0 (H) 150.0 - 400.0 K/uL   Hemoglobin 13.7 12.0 - 15.0 g/dL   HCT 41.8 36.0 - 46.0 %   MCV 89.9 78.0 - 100.0 fl   MCHC 32.9 30.0 - 36.0 g/dL   RDW 13.1 11.5 - 15.5 %  Comprehensive metabolic panel  Result Value Ref Range   Sodium 138 135 - 145 mEq/L   Potassium 4.3 3.5 - 5.1 mEq/L   Chloride 103 96 - 112 mEq/L   CO2 30 19 - 32 mEq/L   Glucose, Bld 90 70 - 99 mg/dL   BUN 8 6 - 23 mg/dL   Creatinine, Ser 0.58 0.40 - 1.20 mg/dL   Total Bilirubin 0.5 0.2 - 1.2 mg/dL   Alkaline Phosphatase 109 39 - 117 U/L   AST 14 0 - 37 U/L   ALT 18 0 - 35 U/L   Total Protein 6.9 6.0 - 8.3 g/dL   Albumin 3.8 3.5 - 5.2 g/dL   Calcium 9.3 8.4 - 10.5 mg/dL   GFR 118.80 >60.00 mL/min  Lipid panel  Result Value Ref Range   Cholesterol 162 0 - 200 mg/dL   Triglycerides 112.0 0.0 - 149.0 mg/dL   HDL 47.90 >39.00 mg/dL   VLDL 22.4 0.0 - 40.0 mg/dL   LDL Cholesterol 92 0 - 99 mg/dL   Total CHOL/HDL Ratio 3    NonHDL 114.41   Hemoglobin A1c  Result Value Ref Range   Hgb A1c MFr Bld 5.5 4.6 - 6.5 %  TSH  Result Value Ref Range   TSH 1.34 0.35 - 4.50 uIU/mL   Received her labs, will send her a message Will refer to hematology for persistent leukocytosis

## 2017-02-09 NOTE — Patient Instructions (Signed)
It was very nice to see you again today! Take care and I will be in touch with your labs Let me know when need medication refills Try wearing an OTC carpal tunnel brace (can order form [employer]) on your wrist when you are sleeping.  If your carpal tunnel symptoms become more severe please let me know Your BP looks fine today Health Maintenance, Female Adopting a healthy lifestyle and getting preventive care can go a long way to promote health and wellness. Talk with your health care provider about what schedule of regular examinations is right for you. This is a good chance for you to check in with your provider about disease prevention and staying healthy. In between checkups, there are plenty of things you can do on your own. Experts have done a lot of research about which lifestyle changes and preventive measures are most likely to keep you healthy. Ask your health care provider for more information. Weight and diet Eat a healthy diet  Be sure to include plenty of vegetables, fruits, low-fat dairy products, and lean protein.  Do not eat a lot of foods high in solid fats, added sugars, or salt.  Get regular exercise. This is one of the most important things you can do for your health.  Most adults should exercise for at least 150 minutes each week. The exercise should increase your heart rate and make you sweat (moderate-intensity exercise).  Most adults should also do strengthening exercises at least twice a week. This is in addition to the moderate-intensity exercise. Maintain a healthy weight  Body mass index (BMI) is a measurement that can be used to identify possible weight problems. It estimates body fat based on height and weight. Your health care provider can help determine your BMI and help you achieve or maintain a healthy weight.  For females 72 years of age and older:  A BMI below 18.5 is considered underweight.  A BMI of 18.5 to 24.9 is normal.  A BMI of 25 to 29.9 is  considered overweight.  A BMI of 30 and above is considered obese. Watch levels of cholesterol and blood lipids  You should start having your blood tested for lipids and cholesterol at 47 years of age, then have this test every 5 years.  You may need to have your cholesterol levels checked more often if:  Your lipid or cholesterol levels are high.  You are older than 47 years of age.  You are at high risk for heart disease. Cancer screening Lung Cancer  Lung cancer screening is recommended for adults 12-50 years old who are at high risk for lung cancer because of a history of smoking.  A yearly low-dose CT scan of the lungs is recommended for people who:  Currently smoke.  Have quit within the past 15 years.  Have at least a 30-pack-year history of smoking. A pack year is smoking an average of one pack of cigarettes a day for 1 year.  Yearly screening should continue until it has been 15 years since you quit.  Yearly screening should stop if you develop a health problem that would prevent you from having lung cancer treatment. Breast Cancer  Practice breast self-awareness. This means understanding how your breasts normally appear and feel.  It also means doing regular breast self-exams. Let your health care provider know about any changes, no matter how small.  If you are in your 20s or 30s, you should have a clinical breast exam (CBE) by a health  care provider every 1-3 years as part of a regular health exam.  If you are 50 or older, have a CBE every year. Also consider having a breast X-ray (mammogram) every year.  If you have a family history of breast cancer, talk to your health care provider about genetic screening.  If you are at high risk for breast cancer, talk to your health care provider about having an MRI and a mammogram every year.  Breast cancer gene (BRCA) assessment is recommended for women who have family members with BRCA-related cancers. BRCA-related  cancers include:  Breast.  Ovarian.  Tubal.  Peritoneal cancers.  Results of the assessment will determine the need for genetic counseling and BRCA1 and BRCA2 testing. Cervical Cancer  Your health care provider may recommend that you be screened regularly for cancer of the pelvic organs (ovaries, uterus, and vagina). This screening involves a pelvic examination, including checking for microscopic changes to the surface of your cervix (Pap test). You may be encouraged to have this screening done every 3 years, beginning at age 55.  For women ages 55-65, health care providers may recommend pelvic exams and Pap testing every 3 years, or they may recommend the Pap and pelvic exam, combined with testing for human papilloma virus (HPV), every 5 years. Some types of HPV increase your risk of cervical cancer. Testing for HPV may also be done on women of any age with unclear Pap test results.  Other health care providers may not recommend any screening for nonpregnant women who are considered low risk for pelvic cancer and who do not have symptoms. Ask your health care provider if a screening pelvic exam is right for you.  If you have had past treatment for cervical cancer or a condition that could lead to cancer, you need Pap tests and screening for cancer for at least 20 years after your treatment. If Pap tests have been discontinued, your risk factors (such as having a new sexual partner) need to be reassessed to determine if screening should resume. Some women have medical problems that increase the chance of getting cervical cancer. In these cases, your health care provider may recommend more frequent screening and Pap tests. Colorectal Cancer  This type of cancer can be detected and often prevented.  Routine colorectal cancer screening usually begins at 47 years of age and continues through 47 years of age.  Your health care provider may recommend screening at an earlier age if you have risk  factors for colon cancer.  Your health care provider may also recommend using home test kits to check for hidden blood in the stool.  A small camera at the end of a tube can be used to examine your colon directly (sigmoidoscopy or colonoscopy). This is done to check for the earliest forms of colorectal cancer.  Routine screening usually begins at age 85.  Direct examination of the colon should be repeated every 5-10 years through 47 years of age. However, you may need to be screened more often if early forms of precancerous polyps or small growths are found. Skin Cancer  Check your skin from head to toe regularly.  Tell your health care provider about any new moles or changes in moles, especially if there is a change in a mole's shape or color.  Also tell your health care provider if you have a mole that is larger than the size of a pencil eraser.  Always use sunscreen. Apply sunscreen liberally and repeatedly throughout the day.  Protect yourself by wearing long sleeves, pants, a wide-brimmed hat, and sunglasses whenever you are outside. Heart disease, diabetes, and high blood pressure  High blood pressure causes heart disease and increases the risk of stroke. High blood pressure is more likely to develop in:  People who have blood pressure in the high end of the normal range (130-139/85-89 mm Hg).  People who are overweight or obese.  People who are African American.  If you are 66-41 years of age, have your blood pressure checked every 3-5 years. If you are 45 years of age or older, have your blood pressure checked every year. You should have your blood pressure measured twice-once when you are at a hospital or clinic, and once when you are not at a hospital or clinic. Record the average of the two measurements. To check your blood pressure when you are not at a hospital or clinic, you can use:  An automated blood pressure machine at a pharmacy.  A home blood pressure  monitor.  If you are between 65 years and 88 years old, ask your health care provider if you should take aspirin to prevent strokes.  Have regular diabetes screenings. This involves taking a blood sample to check your fasting blood sugar level.  If you are at a normal weight and have a low risk for diabetes, have this test once every three years after 47 years of age.  If you are overweight and have a high risk for diabetes, consider being tested at a younger age or more often. Preventing infection Hepatitis B  If you have a higher risk for hepatitis B, you should be screened for this virus. You are considered at high risk for hepatitis B if:  You were born in a country where hepatitis B is common. Ask your health care provider which countries are considered high risk.  Your parents were born in a high-risk country, and you have not been immunized against hepatitis B (hepatitis B vaccine).  You have HIV or AIDS.  You use needles to inject street drugs.  You live with someone who has hepatitis B.  You have had sex with someone who has hepatitis B.  You get hemodialysis treatment.  You take certain medicines for conditions, including cancer, organ transplantation, and autoimmune conditions. Hepatitis C  Blood testing is recommended for:  Everyone born from [birth year] through 1966.  Anyone with known risk factors for hepatitis C. Sexually transmitted infections (STIs)  You should be screened for sexually transmitted infections (STIs) including gonorrhea and chlamydia if:  You are sexually active and are younger than 47 years of age.  You are older than 47 years of age and your health care provider tells you that you are at risk for this type of infection.  Your sexual activity has changed since you were last screened and you are at an increased risk for chlamydia or gonorrhea. Ask your health care provider if you are at risk.  If you do not have HIV, but are at risk, it may be  recommended that you take a prescription medicine daily to prevent HIV infection. This is called pre-exposure prophylaxis (PrEP). You are considered at risk if:  You are sexually active and do not regularly use condoms or know the HIV status of your partner(s).  You take drugs by injection.  You are sexually active with a partner who has HIV. Talk with your health care provider about whether you are at high risk of being infected with HIV.  If you choose to begin PrEP, you should first be tested for HIV. You should then be tested every 3 months for as long as you are taking PrEP. Pregnancy  If you are premenopausal and you may become pregnant, ask your health care provider about preconception counseling.  If you may become pregnant, take 400 to 800 micrograms (mcg) of folic acid every day.  If you want to prevent pregnancy, talk to your health care provider about birth control (contraception). Osteoporosis and menopause  Osteoporosis is a disease in which the bones lose minerals and strength with aging. This can result in serious bone fractures. Your risk for osteoporosis can be identified using a bone density scan.  If you are 47 years of age or older, or if you are at risk for osteoporosis and fractures, ask your health care provider if you should be screened.  Ask your health care provider whether you should take a calcium or vitamin D supplement to lower your risk for osteoporosis.  Menopause may have certain physical symptoms and risks.  Hormone replacement therapy may reduce some of these symptoms and risks. Talk to your health care provider about whether hormone replacement therapy is right for you. Follow these instructions at home:  Schedule regular health, dental, and eye exams.  Stay current with your immunizations.  Do not use any tobacco products including cigarettes, chewing tobacco, or electronic cigarettes.  If you are pregnant, do not drink alcohol.  If you are  breastfeeding, limit how much and how often you drink alcohol.  Limit alcohol intake to no more than 1 drink per day for nonpregnant women. One drink equals 12 ounces of beer, 5 ounces of wine, or 1 ounces of hard liquor.  Do not use street drugs.  Do not share needles.  Ask your health care provider for help if you need support or information about quitting drugs.  Tell your health care provider if you often feel depressed.  Tell your health care provider if you have ever been abused or do not feel safe at home. This information is not intended to replace advice given to you by your health care provider. Make sure you discuss any questions you have with your health care provider. Document Released: 06/07/2011 Document Revised: 04/29/2016 Document Reviewed: 08/26/2015 Elsevier Interactive Patient Education  2017 [employer].

## 2017-02-17 ENCOUNTER — Encounter: Payer: Self-pay | Admitting: Family Medicine

## 2017-02-22 ENCOUNTER — Telehealth: Payer: Self-pay | Admitting: Hematology & Oncology

## 2017-02-22 DIAGNOSIS — M530 Cervicocranial syndrome: Secondary | ICD-10-CM | POA: Diagnosis not present

## 2017-02-22 DIAGNOSIS — M546 Pain in thoracic spine: Secondary | ICD-10-CM | POA: Diagnosis not present

## 2017-02-22 DIAGNOSIS — M545 Low back pain: Secondary | ICD-10-CM | POA: Diagnosis not present

## 2017-02-22 NOTE — Telephone Encounter (Signed)
Patient called and left a message wanting to cancel appts with Dr. Marin Olp. I returned the patient's call. Patient stated that her white blood cell count is always high. Patient stated that if she changes her mind in the Spring/Summer season, she will let Dr. Janett Billow Copland know and request a new referral to Dr. Marin Olp at that time.       [facility] MARCH 2018  [facility]

## 2017-02-24 NOTE — Progress Notes (Signed)
Pt came in office stating she is here for repeat labs. Advised that no labs are currently ordered (lab orders from 1 year ago are in system). Pt stated sending mychart msg and not getting a reply. Pt declining referral to hematology and wants to have repeats labs here. Pt requesting contact via mychart from Dr. Lorelei Pont.

## 2017-02-27 ENCOUNTER — Other Ambulatory Visit: Payer: Self-pay | Admitting: Family Medicine

## 2017-02-27 DIAGNOSIS — D72829 Elevated white blood cell count, unspecified: Secondary | ICD-10-CM

## 2017-02-28 DIAGNOSIS — M530 Cervicocranial syndrome: Secondary | ICD-10-CM | POA: Diagnosis not present

## 2017-02-28 DIAGNOSIS — M545 Low back pain: Secondary | ICD-10-CM | POA: Diagnosis not present

## 2017-02-28 DIAGNOSIS — M546 Pain in thoracic spine: Secondary | ICD-10-CM | POA: Diagnosis not present

## 2017-03-03 ENCOUNTER — Other Ambulatory Visit: Payer: 59

## 2017-03-03 ENCOUNTER — Ambulatory Visit: Payer: 59 | Admitting: Hematology & Oncology

## 2017-03-03 ENCOUNTER — Ambulatory Visit: Payer: 59

## 2017-03-07 DIAGNOSIS — M546 Pain in thoracic spine: Secondary | ICD-10-CM | POA: Diagnosis not present

## 2017-03-07 DIAGNOSIS — M530 Cervicocranial syndrome: Secondary | ICD-10-CM | POA: Diagnosis not present

## 2017-03-07 DIAGNOSIS — M545 Low back pain: Secondary | ICD-10-CM | POA: Diagnosis not present

## 2017-03-11 ENCOUNTER — Other Ambulatory Visit: Payer: Self-pay | Admitting: Family Medicine

## 2017-03-14 ENCOUNTER — Other Ambulatory Visit: Payer: Self-pay | Admitting: Emergency Medicine

## 2017-03-14 MED ORDER — TEMAZEPAM 30 MG PO CAPS: ORAL_CAPSULE | ORAL | 2 refills | Status: DC

## 2017-03-14 MED FILL — TEMAZEPAM 30 MG CAPSULE: 30 | 30 days supply | Qty: 30 | Fill #0

## 2017-03-14 NOTE — Telephone Encounter (Signed)
Reviewed NCCSR:  She got tempazepam on 2/19, 1/16; she uses this on a regular basis for sleep  Also xanax 0.25, #30 on 2/20- however she uses this only rarely and has been reminded not to combine with remazepam  Will refill her temazepam today

## 2017-03-14 NOTE — Telephone Encounter (Signed)
Requesting: temazepam (RESTORIL) 30 MG capsule Contract:  UDS Last OV: 02/09/17 Last Refill: 12/21/16  Please Advise

## 2017-03-22 DIAGNOSIS — M530 Cervicocranial syndrome: Secondary | ICD-10-CM | POA: Diagnosis not present

## 2017-03-22 DIAGNOSIS — M545 Low back pain: Secondary | ICD-10-CM | POA: Diagnosis not present

## 2017-03-22 DIAGNOSIS — M546 Pain in thoracic spine: Secondary | ICD-10-CM | POA: Diagnosis not present

## 2017-03-24 ENCOUNTER — Encounter: Payer: Self-pay | Admitting: Family Medicine

## 2017-03-24 ENCOUNTER — Other Ambulatory Visit (INDEPENDENT_AMBULATORY_CARE_PROVIDER_SITE_OTHER): Payer: 59

## 2017-03-24 DIAGNOSIS — D72829 Elevated white blood cell count, unspecified: Secondary | ICD-10-CM

## 2017-03-24 LAB — CBC
HCT: 42.6 % (ref 36.0–46.0)
Hemoglobin: 14.1 g/dL (ref 12.0–15.0)
MCHC: 33 g/dL (ref 30.0–36.0)
MCV: 89.6 fl (ref 78.0–100.0)
Platelets: 402 10*3/uL — ABNORMAL HIGH (ref 150.0–400.0)
RBC: 4.75 Mil/uL (ref 3.87–5.11)
RDW: 12.6 % (ref 11.5–15.5)
WBC: 11.3 10*3/uL — AB (ref 4.0–10.5)

## 2017-03-30 DIAGNOSIS — M545 Low back pain: Secondary | ICD-10-CM | POA: Diagnosis not present

## 2017-03-30 DIAGNOSIS — M530 Cervicocranial syndrome: Secondary | ICD-10-CM | POA: Diagnosis not present

## 2017-03-30 DIAGNOSIS — M546 Pain in thoracic spine: Secondary | ICD-10-CM | POA: Diagnosis not present

## 2017-04-06 DIAGNOSIS — M530 Cervicocranial syndrome: Secondary | ICD-10-CM | POA: Diagnosis not present

## 2017-04-06 DIAGNOSIS — M545 Low back pain: Secondary | ICD-10-CM | POA: Diagnosis not present

## 2017-04-06 DIAGNOSIS — M546 Pain in thoracic spine: Secondary | ICD-10-CM | POA: Diagnosis not present

## 2017-04-07 ENCOUNTER — Encounter: Payer: Self-pay | Admitting: Family Medicine

## 2017-04-07 MED ORDER — VALACYCLOVIR HCL 1 G PO TABS
ORAL_TABLET | ORAL | 0 refills | Status: DC
Start: 2017-04-07 — End: 2017-05-23

## 2017-04-13 DIAGNOSIS — M546 Pain in thoracic spine: Secondary | ICD-10-CM | POA: Diagnosis not present

## 2017-04-13 DIAGNOSIS — M545 Low back pain: Secondary | ICD-10-CM | POA: Diagnosis not present

## 2017-04-13 DIAGNOSIS — M530 Cervicocranial syndrome: Secondary | ICD-10-CM | POA: Diagnosis not present

## 2017-04-15 MED FILL — TEMAZEPAM 30 MG CAPSULE: 30 | 30 days supply | Qty: 30 | Fill #1

## 2017-04-19 DIAGNOSIS — M546 Pain in thoracic spine: Secondary | ICD-10-CM | POA: Diagnosis not present

## 2017-04-19 DIAGNOSIS — M545 Low back pain: Secondary | ICD-10-CM | POA: Diagnosis not present

## 2017-04-19 DIAGNOSIS — M530 Cervicocranial syndrome: Secondary | ICD-10-CM | POA: Diagnosis not present

## 2017-05-04 DIAGNOSIS — M530 Cervicocranial syndrome: Secondary | ICD-10-CM | POA: Diagnosis not present

## 2017-05-04 DIAGNOSIS — M546 Pain in thoracic spine: Secondary | ICD-10-CM | POA: Diagnosis not present

## 2017-05-04 DIAGNOSIS — M545 Low back pain: Secondary | ICD-10-CM | POA: Diagnosis not present

## 2017-05-06 ENCOUNTER — Ambulatory Visit: Payer: 59

## 2017-05-06 ENCOUNTER — Other Ambulatory Visit: Payer: 59

## 2017-05-06 ENCOUNTER — Other Ambulatory Visit: Payer: Self-pay | Admitting: Family Medicine

## 2017-05-06 ENCOUNTER — Ambulatory Visit: Payer: 59 | Admitting: Hematology & Oncology

## 2017-05-06 DIAGNOSIS — G4726 Circadian rhythm sleep disorder, shift work type: Secondary | ICD-10-CM

## 2017-05-09 DIAGNOSIS — M546 Pain in thoracic spine: Secondary | ICD-10-CM | POA: Diagnosis not present

## 2017-05-09 DIAGNOSIS — M545 Low back pain: Secondary | ICD-10-CM | POA: Diagnosis not present

## 2017-05-09 DIAGNOSIS — M530 Cervicocranial syndrome: Secondary | ICD-10-CM | POA: Diagnosis not present

## 2017-05-10 ENCOUNTER — Other Ambulatory Visit: Payer: 59

## 2017-05-10 ENCOUNTER — Ambulatory Visit: Payer: 59

## 2017-05-10 ENCOUNTER — Ambulatory Visit: Payer: 59 | Admitting: Hematology & Oncology

## 2017-05-11 ENCOUNTER — Other Ambulatory Visit: Payer: Self-pay | Admitting: Emergency Medicine

## 2017-05-11 DIAGNOSIS — G4726 Circadian rhythm sleep disorder, shift work type: Secondary | ICD-10-CM

## 2017-05-11 MED ORDER — TRAZODONE HCL 50 MG PO TABS: 50.0000 mg | ORAL_TABLET | Freq: Every day | ORAL | 1 refills | Status: DC

## 2017-05-11 NOTE — Telephone Encounter (Signed)
Requesting: traZODone (DESYREL) 50 MG tablet Contract UDS Last OV: 02/09/17 Last Refill: 10/04/16  Please Advise

## 2017-05-17 DIAGNOSIS — M545 Low back pain: Secondary | ICD-10-CM | POA: Diagnosis not present

## 2017-05-17 DIAGNOSIS — M546 Pain in thoracic spine: Secondary | ICD-10-CM | POA: Diagnosis not present

## 2017-05-17 DIAGNOSIS — M530 Cervicocranial syndrome: Secondary | ICD-10-CM | POA: Diagnosis not present

## 2017-05-19 MED FILL — TEMAZEPAM 30 MG CAPSULE: 30 | 30 days supply | Qty: 30 | Fill #2

## 2017-05-23 ENCOUNTER — Other Ambulatory Visit (HOSPITAL_BASED_OUTPATIENT_CLINIC_OR_DEPARTMENT_OTHER): Payer: 59

## 2017-05-23 ENCOUNTER — Ambulatory Visit (HOSPITAL_BASED_OUTPATIENT_CLINIC_OR_DEPARTMENT_OTHER): Payer: 59 | Admitting: Hematology & Oncology

## 2017-05-23 ENCOUNTER — Ambulatory Visit: Payer: 59

## 2017-05-23 VITALS — BP 125/76 | HR 81 | Temp 98.3°F | Resp 16 | Wt 326.0 lb

## 2017-05-23 DIAGNOSIS — D72823 Leukemoid reaction: Secondary | ICD-10-CM | POA: Insufficient documentation

## 2017-05-23 DIAGNOSIS — D72829 Elevated white blood cell count, unspecified: Secondary | ICD-10-CM

## 2017-05-23 LAB — CBC WITH DIFFERENTIAL (CANCER CENTER ONLY)
BASO#: 0 10*3/uL (ref 0.0–0.2)
BASO%: 0.2 % (ref 0.0–2.0)
EOS ABS: 0.1 10*3/uL (ref 0.0–0.5)
EOS%: 1.2 % (ref 0.0–7.0)
HCT: 42.6 % (ref 34.8–46.6)
HEMOGLOBIN: 14.4 g/dL (ref 11.6–15.9)
LYMPH#: 3.4 10*3/uL — ABNORMAL HIGH (ref 0.9–3.3)
LYMPH%: 29.9 % (ref 14.0–48.0)
MCH: 30.7 pg (ref 26.0–34.0)
MCHC: 33.8 g/dL (ref 32.0–36.0)
MCV: 91 fL (ref 81–101)
MONO#: 0.8 10*3/uL (ref 0.1–0.9)
MONO%: 6.9 % (ref 0.0–13.0)
NEUT%: 61.8 % (ref 39.6–80.0)
NEUTROS ABS: 6.9 10*3/uL — AB (ref 1.5–6.5)
Platelets: 354 10*3/uL (ref 145–400)
RBC: 4.69 10*6/uL (ref 3.70–5.32)
RDW: 12.4 % (ref 11.1–15.7)
WBC: 11.2 10*3/uL — ABNORMAL HIGH (ref 3.9–10.0)

## 2017-05-23 LAB — CHCC SATELLITE - SMEAR

## 2017-05-23 MED FILL — traZODone HCL 50 MG TABS: 50 | 90 days supply | Qty: 90 | Fill #0

## 2017-05-23 NOTE — Progress Notes (Signed)
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 New Patient (Initial Visit)  : My white blood cell count has been high for a while.  HPI: Mrs. Vantil is a very charming 46 year old white female. 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  She is originally from [place]. She and her mother moved down here 2 years ago. She moved out because she wanted warmer weather.  She has never had problems with infections. She's had no surgeries. She's had no issues with respect to rashes. She's not had a monthly cycle in about 3 months. If she is quite heavy.  She has had some mild leukocytosis. Going back to March of last year, her white cell count was 12.2. She was not anemic or thrombocytopenic. There is no thrombocytosis.  In March of this year, her white count was 13.9. Her hemoglobin was 13.7 and platelet count was 404,000.  Her family doctor wanted her to be seen by hematology. Ms. Donatelli didn't think that there is anything really wrong. 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  She is to smoke. She stopped 5 years ago. She probably smoked for about 20 years.  She does not drink.  His been no change in bowel or bladder habits.  Her last mammogram was back in April 2017. This looked okay.  There is a history of breast cancer with her mother. Her mother was in her 60s when she had breast cancer. Her mother is still doing okay and is in remission.  She has had no rashes. She's had no leg swelling.  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 She really enjoys this.  Overall, her performance status is ECOG 0.    Past Medical History:  Diagnosis Date  . Depression   . Hay fever   :  Past Surgical History:  Procedure Laterality Date  . WISDOM TOOTH EXTRACTION  1988  :   Current Outpatient Prescriptions:  .  ALPRAZolam (XANAX) 0.25 MG tablet, Take 1/2  or 1 once daily as needed for anxiety, Disp: 30 tablet, Rfl: 0 .  HYDROcodone-acetaminophen (NORCO) 5-325 MG tablet, Take 1 tablet by mouth at bedtime as needed for moderate pain., Disp: 30 tablet, Rfl: 0 .  temazepam (RESTORIL) 30 MG capsule, TAKE 1 CAPSULE BY MOUTH DAILY AT BEDTIME AS NEEDED FOR SLEEP, Disp: 30 capsule, Rfl: 2 .  traZODone (DESYREL) 50 MG tablet, Take 1 tablet (50 mg total) by mouth at bedtime., Disp: 90 tablet, Rfl: 1:  :  Allergies  Allergen Reactions  . Pollen Extract   . Tape Dermatitis, Itching and Rash  :  Family History  Problem Relation Age of Onset  . Breast cancer Mother   . Hypertension Mother   . Hypertension Father   . Tongue cancer Father   :  Social History   Social History  . Marital status: Single    Spouse name: N/A  . Number of children: N/A  . Years of education: N/A   Occupational History  . RN [employer]   Social History Main Topics  . Smoking status: Never Smoker  . Smokeless tobacco: Never Used  . Alcohol use No  . Drug use: No  . Sexual activity: Not on file   Other Topics Concern  . Not on file   Social History Narrative  . No narrative on file  :  Pertinent items are noted in HPI.  Exam:Obese white female in no obvious distress. Vital signs show temperature of 98.3. Pulse 81. Blood pressure 125/76. Weight is 326 pounds. Head and neck exam shows no ocular or oral lesions. There are no palpable cervical or supraclavicular lymph nodes. Lungs are clear bilaterally. Cardiac exam regular rate and rhythm with no murmurs, rubs or bruits. Abdomen is soft. She is obese. She has no fluid wave. There is no palpable liver or spleen tip. There is no palpable adenopathy. Back exam shows no tenderness over the spine, ribs or hips. Extremities shows no clubbing, cyanosis or edema. She has good range of motion of her joints. Skin exam shows no rashes, ecchymoses or petechia. Neurological exam shows no focal neurological  deficits.    Recent Labs  05/23/17 1445  WBC 11.2*  HGB 14.4  HCT 42.6  PLT 354   No results for input(s): NA, K, CL, CO2, GLUCOSE, BUN, CREATININE, CALCIUM in the last 72 hours.  Blood smear review:  Normochromic and normocytic population of red blood cells. There are no nucleated red blood cells. There are no teardrop cells. I see no rouleau formation. White cells are minimally increased in number. There is no immature myeloid or lymphoid forms. There are no hyper segmented polys. Platelets are adequate in number and size.  Pathology: None     Assessment and Plan:  Mrs. Gabrielle is a very nice 46 year old white female. 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 She has minimal leukocytosis.  I cannot find anything on her exam or on the blood smear that was suggesting underlying marrow issue. I do not see anything that looks like a myeloproliferative disorder.  I really don't think that we need to do a bone marrow biopsy on her. I don't see an if for any type of radiographic studies.  I have to believe that the mild leukocytosis is reactive. I know she has not smoked for 5 years. Sometimes we still see a chronic mild leukocytosis after somebody has stopped smoking.  I really don't see that we have to follow her up. Not [place] sure that we really are adding to her medical care. 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 She is not worried about the leukocytosis.  I spent about 45 minutes with her. It was very nice talking to her.  I will be more than happy to see her back in the future if any issues arise.

## 2017-06-07 DIAGNOSIS — M546 Pain in thoracic spine: Secondary | ICD-10-CM | POA: Diagnosis not present

## 2017-06-07 DIAGNOSIS — M545 Low back pain: Secondary | ICD-10-CM | POA: Diagnosis not present

## 2017-06-07 DIAGNOSIS — M530 Cervicocranial syndrome: Secondary | ICD-10-CM | POA: Diagnosis not present

## 2017-06-13 DIAGNOSIS — M546 Pain in thoracic spine: Secondary | ICD-10-CM | POA: Diagnosis not present

## 2017-06-13 DIAGNOSIS — M530 Cervicocranial syndrome: Secondary | ICD-10-CM | POA: Diagnosis not present

## 2017-06-13 DIAGNOSIS — M545 Low back pain: Secondary | ICD-10-CM | POA: Diagnosis not present

## 2017-06-22 ENCOUNTER — Other Ambulatory Visit: Payer: Self-pay | Admitting: Family Medicine

## 2017-06-23 ENCOUNTER — Other Ambulatory Visit: Payer: Self-pay | Admitting: Emergency Medicine

## 2017-06-23 MED ORDER — TEMAZEPAM 30 MG PO CAPS: ORAL_CAPSULE | ORAL | 3 refills | Status: DC

## 2017-06-23 MED FILL — TEMAZEPAM 30 MG CAPSULE: 30 | 30 days supply | Qty: 30 | Fill #0

## 2017-06-23 NOTE — Telephone Encounter (Signed)
NCCSR: filled temazepam on 6/14 No unexpected entries noted  Ok to refill

## 2017-07-28 MED FILL — TEMAZEPAM 30 MG CAPSULE: 30 | 30 days supply | Qty: 30 | Fill #1

## 2017-08-15 ENCOUNTER — Encounter: Payer: Self-pay | Admitting: *Deleted

## 2017-08-15 ENCOUNTER — Emergency Department
Admission: EM | Admit: 2017-08-15 | Discharge: 2017-08-15 | Disposition: A | Discharge status: Home / Self Care | Payer: 59 | Source: Home / Self Care | Attending: Family Medicine | Admitting: Family Medicine

## 2017-08-15 DIAGNOSIS — B9789 Other viral agents as the cause of diseases classified elsewhere: Secondary | ICD-10-CM

## 2017-08-15 DIAGNOSIS — J069 Acute upper respiratory infection, unspecified: Secondary | ICD-10-CM | POA: Diagnosis not present

## 2017-08-15 MED ORDER — DOXYCYCLINE HYCLATE 100 MG PO CAPS: 100.0000 mg | ORAL_CAPSULE | Freq: Two times a day (BID) | ORAL | 0 refills | Status: DC

## 2017-08-15 MED FILL — DOXYCYCLINE HYCLATE 100 MG: 100 | 7 days supply | Qty: 14 | Fill #0

## 2017-08-15 NOTE — ED Provider Notes (Signed)
[facility]    CSN: [id number] Arrival date & time: 08/15/17  1223     History   Chief Complaint Chief Complaint  Patient presents with  . Cough  . Chills  . Nasal Congestion    HPI Leslie Wiggins is a 46 y.o. female.   Patient complains of five day history of typical cold-like symptoms developing over several days, including mild sore throat, sinus congestion, headache, fatigue, chills/sweats, and cough.  She has a history of pneumonia about 2 years ago.   The history is provided by the patient.    Past Medical History:  Diagnosis Date  . Depression   . Hay fever     Patient Active Problem List   Diagnosis Date Noted  . 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 Obesity 02/26/2016    Past Surgical History:  Procedure Laterality Date  . WISDOM TOOTH EXTRACTION  1988  . WISDOM TOOTH EXTRACTION      OB History    No data available       Home Medications    Prior to Admission medications   Medication Sig Start Date End Date Taking? Authorizing Provider  temazepam (RESTORIL) 30 MG capsule TAKE 1 CAPSULE BY MOUTH DAILY AT BEDTIME AS NEEDED FOR SLEEP 06/23/17  Yes Copland, Gay Filler, MD  traZODone (DESYREL) 50 MG tablet Take 1 tablet (50 mg total) by mouth at bedtime. 05/11/17  Yes Copland, Gay Filler, MD  ALPRAZolam Duanne Moron) 0.25 MG tablet Take 1/2 or 1 once daily as needed for anxiety 01/25/17   Copland, Gay Filler, MD  doxycycline (VIBRAMYCIN) 100 MG capsule Take 1 capsule (100 mg total) by mouth 2 (two) times daily. Take with food. 08/15/17   Kandra Nicolas, MD    Family History Family History  Problem Relation Age of Onset  . Breast cancer Mother   . Hypertension Mother   . Hypertension Father   . Tongue cancer Father     Social History Social History  Substance Use Topics  . Smoking status: Former Research scientist (life sciences)  . Smokeless tobacco: Never Used  . Alcohol use No     Allergies   Pollen extract and Tape   Review of Systems Review of Systems +  sore throat + cough No pleuritic pain No wheezing + nasal congestion + post-nasal drainage No sinus pain/pressure No itchy/red eyes No earache No hemoptysis No SOB No fever, + chills/sweats No nausea No vomiting No abdominal pain No diarrhea No urinary symptoms No skin rash + fatigue + myalgias + headache Used OTC meds without relief   Physical Exam Triage Vital Signs ED Triage Vitals  Enc Vitals Group     BP 08/15/17 1243 (!) 157/95     Pulse Rate 08/15/17 1243 97     Resp 08/15/17 1243 16     Temp 08/15/17 1243 97.7 F (36.5 C)     Temp Source 08/15/17 1243 Oral     SpO2 08/15/17 1243 98 %     Weight 08/15/17 1244 (!) 318 lb (144.2 kg)     Height 08/15/17 1244 5\' 6"  (1.676 m)     Head Circumference --      Peak Flow --      Pain Score 08/15/17 1244 0     Pain Loc --      Pain Edu? --      Excl. in [place]? --    No data found.   Updated Vital Signs BP (!) 157/95 (BP Location: Left Arm)   Pulse  97   Temp 97.7 F (36.5 C) (Oral)   Resp 16   Ht 5\' 6"  (1.676 m)   Wt (!) 318 lb (144.2 kg)   LMP 06/08/2017 Comment: Reports periods are irregular  SpO2 98%   BMI 51.33 kg/m   Visual Acuity Right Eye Distance:   Left Eye Distance:   Bilateral Distance:    Right Eye Near:   Left Eye Near:    Bilateral Near:     Physical Exam Nursing notes and Vital Signs reviewed. Appearance:  Patient appears stated age, and in no acute distress Eyes:  Pupils are equal, round, and reactive to light and accomodation.  Extraocular movement is intact.  Conjunctivae are not inflamed  Ears:  Canals normal.  Tympanic membranes normal.  Nose:   Congested turbinates.  No sinus tenderness.    Pharynx:  Normal Neck:  Supple.  Enlarged posterior/lateral nodes are palpated bilaterally, tender to palpation on the left.   Lungs:  Clear to auscultation.  Breath sounds are equal.  Moving air well. Heart:  Regular rate and rhythm without murmurs, rubs, or gallops.  Abdomen:  Nontender  without masses or hepatosplenomegaly.  Bowel sounds are present.  No CVA or flank tenderness.  Extremities:  No edema.  Skin:  No rash present.    UC Treatments / Results  Labs (all labs ordered are listed, but only abnormal results are displayed) Labs Reviewed - No data to display  EKG  EKG Interpretation None       Radiology No results found.  Procedures Procedures (including critical care time)  Medications Ordered in UC Medications - No data to display   Initial Impression / Assessment and Plan / UC Course  I have reviewed the triage vital signs and the nursing notes.  Pertinent labs & imaging results that were available during my care of the patient were reviewed by me and considered in my medical decision making (see chart for details).    With her past history of pneumonia, will begin doxycycline for atypical coverage. Take plain guaifenesin (1200mg  extended release tabs such as Mucinex) twice daily, with plenty of water, for cough and congestion.  May continue Pseudoephedrine for sinus congestion.  Get adequate rest.   May use Afrin nasal spray (or generic oxymetazoline) each morning for about 5 days and then discontinue.  Also recommend using saline nasal spray several times daily and saline nasal irrigation (AYR is a common brand).  Use Flonase nasal spray each morning after using Afrin nasal spray and saline nasal irrigation. Try warm salt water gargles for sore throat.  Stop all antihistamines for now, and other non-prescription cough/cold preparations. May take Ibuprofen 200mg , 4 tabs every 8 hours with food for sore throat, body aches, etc. May take Delsym Cough Suppressant at bedtime for nighttime cough.  Followup with Family Doctor if not improved in about 10 days.    Final Clinical Impressions(s) / UC Diagnoses   Final diagnoses:  Viral URI with cough    New Prescriptions New Prescriptions   DOXYCYCLINE (VIBRAMYCIN) 100 MG CAPSULE    Take 1 capsule  (100 mg total) by mouth 2 (two) times daily. Take with food.         Kandra Nicolas, MD 08/15/17 1420

## 2017-08-15 NOTE — ED Triage Notes (Signed)
Patient c/o 3 days of productive cough, nasal congestion, chills/sweats and SOB with exertion.

## 2017-08-15 NOTE — Discharge Instructions (Signed)
Take plain guaifenesin (1200mg  extended release tabs such as Mucinex) twice daily, with plenty of water, for cough and congestion.  May continue Pseudoephedrine for sinus congestion.  Get adequate rest.   May use Afrin nasal spray (or generic oxymetazoline) each morning for about 5 days and then discontinue.  Also recommend using saline nasal spray several times daily and saline nasal irrigation (AYR is a common brand).  Use Flonase nasal spray each morning after using Afrin nasal spray and saline nasal irrigation. Try warm salt water gargles for sore throat.  Stop all antihistamines for now, and other non-prescription cough/cold preparations. May take Ibuprofen 200mg , 4 tabs every 8 hours with food for sore throat, body aches, etc. May take Delsym Cough Suppressant at bedtime for nighttime cough.

## 2017-09-05 MED FILL — valACYclovir HCL 1 GM TABS: 1 | 7 days supply | Qty: 21 | Fill #0

## 2017-09-05 MED FILL — traZODone HCL 50 MG TABS: 50 | 90 days supply | Qty: 90 | Fill #1

## 2017-09-05 MED FILL — TEMAZEPAM 30 MG CAPSULE: 30 | 30 days supply | Qty: 30 | Fill #2

## 2017-10-14 MED FILL — TEMAZEPAM 30 MG CAPSULE: 30 | 30 days supply | Qty: 30 | Fill #3

## 2017-11-23 ENCOUNTER — Encounter: Payer: Self-pay | Admitting: Family Medicine

## 2017-11-23 ENCOUNTER — Other Ambulatory Visit: Payer: Self-pay | Admitting: Family Medicine

## 2017-11-23 NOTE — Telephone Encounter (Signed)
Received refill request for temazepam (RESTORIL) 30 MG capsule. Last office visit 02/09/17 and refill 06/23/2017. Is it ok to refill? Please advise.

## 2017-11-24 MED FILL — TEMAZEPAM 30 MG CAPSULE: 30 | 30 days supply | Qty: 30 | Fill #0

## 2017-12-07 ENCOUNTER — Other Ambulatory Visit: Payer: Self-pay | Admitting: Family Medicine

## 2018-01-03 NOTE — Progress Notes (Addendum)
[facility] [place], [place], [place] [address] [phone]  Date:  01/04/2018   Name:  Leslie Wiggins   DOB:  1970/12/13   MRN:  [id number]  PCP:  Darreld Mclean, MD    Chief Complaint: Medication Refill   History of Present Illness:  Leslie Wiggins is a 47 y.o. 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  discussed screening CT starting at age 89 for lung cancer Pap is UTD  Patient Active Problem List   Diagnosis Date Noted  . 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 Obesity 02/26/2016    Past Medical History:  Diagnosis Date  . Depression   . Hay fever     Past Surgical History:  Procedure Laterality Date  . WISDOM TOOTH EXTRACTION  1988  . WISDOM TOOTH EXTRACTION      Social History   Tobacco Use  . Smoking status: Former Research scientist (life sciences)  . Smokeless tobacco: Never Used  Substance Use Topics  . Alcohol use: No    Alcohol/week: 0.0 oz  . Drug use: No    Family History  Problem Relation Age of Onset  . Breast cancer Mother   . Hypertension Mother   . Hypertension Father   . Tongue cancer Father     Allergies  Allergen Reactions  . Pollen  Extract   . Tape Dermatitis, Itching and Rash    Medication list has been reviewed and updated.  Current Outpatient Medications on File Prior to Visit  Medication Sig Dispense Refill  . ALPRAZolam (XANAX) 0.25 MG tablet Take 1/2 or 1 once daily as needed for anxiety 30 tablet 0  . doxycycline (VIBRAMYCIN) 100 MG capsule Take 1 capsule (100 mg total) by mouth 2 (two) times daily. Take with food. 14 capsule 0  . temazepam (RESTORIL) 30 MG capsule TAKE 1 CAPSULE BY MOUTH AT BEDTIME AS NEEDED FOR SLEEP 30 capsule 0  . traZODone (DESYREL) 50 MG tablet Take 1 tablet (50 mg total) by mouth at bedtime. 90 tablet 1   No current facility-administered medications on file prior to visit.     Review of Systems:  As per HPI- otherwise negative.   Physical Examination: Vitals:   01/04/18 1255  BP: (!) 156/80  Pulse: (!) 102  Resp: 16  Temp: 97.8 F (36.6 C)  SpO2: 98%   Vitals:   01/04/18 1255  Weight: (!) 311 lb (141.1 kg)  Height: 5' 6" (1.676 m)   Body mass index is 50.2 kg/m. Ideal Body Weight: Weight in (lb) to have BMI = 25:  154.6  GEN: WDWN, NAD, Non-toxic, A & O x 3, obese, otherwise looks well  HEENT: Atraumatic, Normocephalic. Neck supple. No masses, No LAD.  Bilateral TM wnl, oropharynx normal.  PEERL,EOMI.   Ears and Nose: No external deformity. CV: RRR, No M/G/R. No JVD. No thrill. No extra heart sounds. PULM: no crackles, rhonchi. No retractions. No resp. distress. No accessory muscle use. Heard one wheeze in the left lower lung  EXTR: No c/c/e NEURO Normal gait.  PSYCH: Normally interactive. Conversant. Not depressed or anxious appearing.  Calm demeanor.    Assessment and Plan: Situational anxiety - Plan: ALPRAZolam (XANAX) 0.25 MG tablet  Shift work sleep disorder - Plan: temazepam (RESTORIL) 30 MG capsule, traZODone (DESYREL) 50 MG tablet  Screening for diabetes mellitus - Plan: Comprehensive metabolic panel, Hemoglobin A1c  Screening for hyperlipidemia -  Plan: Lipid panel  Screening for deficiency anemia - Plan: CBC  Refilled her medications for sleep and anxiety Labs pending as above She will monitor her BP for me- it is a bit high today   Will plan further follow- up pending labs. See patient instructions for more details.    BP Readings from Last 3 Encounters:  01/04/18 (!) 156/80  08/15/17 (!) 157/95  05/23/17 125/76     Signed Lamar Blinks, MD  Received her labs 1/30-  You continue to have a minimally elevated white blood cell count Metabolic profile is normal except for slightly high alk phos- this is so slightly elevated that I am really not concerned about it.  The rest of your liver labs are normal.  Let's plan to repeat this test in 6 months A1c is normal, not in the pre-diabetes or diabetes range Cholesterol looks good!  Take care and please see me in about 6 months Results for orders placed or performed in visit on 01/04/18  CBC  Result Value Ref Range   WBC 11.7 (H) 4.0 - 10.5 K/uL   RBC 5.08 3.87 - 5.11 Mil/uL   Platelets 407.0 (H) 150.0 - 400.0 K/uL   Hemoglobin 15.2 (H) 12.0 - 15.0 g/dL   HCT 45.5 36.0 - 46.0 %   MCV 89.6 78.0 - 100.0 fl   MCHC 33.3 30.0 - 36.0 g/dL   RDW 12.6 11.5 - 15.5 %  Comprehensive metabolic panel  Result Value Ref Range   Sodium 137 135 - 145 mEq/L   Potassium 3.9 3.5 - 5.1 mEq/L   Chloride 102 96 - 112 mEq/L   CO2 28 19 - 32 mEq/L   Glucose, Bld 97 70 - 99 mg/dL   BUN 9 6 - 23 mg/dL   Creatinine, Ser 0.60 0.40 - 1.20 mg/dL   Total Bilirubin 0.8 0.2 - 1.2 mg/dL   Alkaline Phosphatase 122 (H) 39 - 117 U/L   AST 19 0 - 37 U/L   ALT 22 0 - 35 U/L   Total Protein 7.6 6.0 - 8.3 g/dL   Albumin 4.1 3.5 - 5.2 g/dL   Calcium 9.1 8.4 - 10.5 mg/dL   GFR 113.79 >60.00 mL/min  Hemoglobin A1c  Result Value Ref Range   Hgb A1c MFr Bld 5.5 4.6 - 6.5 %  Lipid panel  Result Value Ref Range   Cholesterol 157 0 - 200 mg/dL   Triglycerides 76.0 0.0 - 149.0 mg/dL   HDL 47.30 >39.00  mg/dL   VLDL 15.2 0.0 - 40.0 mg/dL   LDL Cholesterol 95 0 - 99 mg/dL   Total CHOL/HDL Ratio 3    NonHDL  109.99

## 2018-01-04 ENCOUNTER — Ambulatory Visit: Payer: 59 | Admitting: Family Medicine

## 2018-01-04 ENCOUNTER — Encounter: Payer: Self-pay | Admitting: Family Medicine

## 2018-01-04 VITALS — BP 156/80 | HR 90 | Temp 97.8°F | Resp 16 | Ht 66.0 in | Wt 311.0 lb

## 2018-01-04 DIAGNOSIS — F418 Other specified anxiety disorders: Secondary | ICD-10-CM | POA: Diagnosis not present

## 2018-01-04 DIAGNOSIS — Z130 Encounter for screening for diseases of the blood and blood-forming organs and certain disorders involving the immune mechanism: Secondary | ICD-10-CM

## 2018-01-04 DIAGNOSIS — Z13220 Encounter for screening for lipoid disorders: Secondary | ICD-10-CM

## 2018-01-04 DIAGNOSIS — Z131 Encounter for screening for diabetes mellitus: Secondary | ICD-10-CM | POA: Diagnosis not present

## 2018-01-04 DIAGNOSIS — G4726 Circadian rhythm sleep disorder, shift work type: Secondary | ICD-10-CM | POA: Diagnosis not present

## 2018-01-04 LAB — COMPREHENSIVE METABOLIC PANEL
ALBUMIN: 4.1 g/dL (ref 3.5–5.2)
ALK PHOS: 122 U/L — AB (ref 39–117)
ALT: 22 U/L (ref 0–35)
AST: 19 U/L (ref 0–37)
BUN: 9 mg/dL (ref 6–23)
CO2: 28 mEq/L (ref 19–32)
Calcium: 9.1 mg/dL (ref 8.4–10.5)
Chloride: 102 mEq/L (ref 96–112)
Creatinine, Ser: 0.6 mg/dL (ref 0.40–1.20)
GFR: 113.79 mL/min (ref >60.00)
Glucose, Bld: 97 mg/dL (ref 70–99)
POTASSIUM: 3.9 meq/L (ref 3.5–5.1)
Sodium: 137 mEq/L (ref 135–145)
TOTAL PROTEIN: 7.6 g/dL (ref 6.0–8.3)
Total Bilirubin: 0.8 mg/dL (ref 0.2–1.2)

## 2018-01-04 LAB — CBC
HEMATOCRIT: 45.5 % (ref 36.0–46.0)
HEMOGLOBIN: 15.2 g/dL — AB (ref 12.0–15.0)
MCHC: 33.3 g/dL (ref 30.0–36.0)
MCV: 89.6 fl (ref 78.0–100.0)
Platelets: 407 10*3/uL — ABNORMAL HIGH (ref 150.0–400.0)
RBC: 5.08 Mil/uL (ref 3.87–5.11)
RDW: 12.6 % (ref 11.5–15.5)
WBC: 11.7 10*3/uL — ABNORMAL HIGH (ref 4.0–10.5)

## 2018-01-04 LAB — HEMOGLOBIN A1C: HEMOGLOBIN A1C: 5.5 % (ref 4.6–6.5)

## 2018-01-04 LAB — LIPID PANEL
Cholesterol: 157 mg/dL (ref 0–200)
HDL: 47.3 mg/dL (ref >39.00)
LDL Cholesterol: 95 mg/dL (ref 0–99)
NonHDL: 109.99
TRIGLYCERIDES: 76 mg/dL (ref 0.0–149.0)
Total CHOL/HDL Ratio: 3
VLDL: 15.2 mg/dL (ref 0.0–40.0)

## 2018-01-04 MED ORDER — ALPRAZOLAM 0.25 MG PO TABS: ORAL_TABLET | ORAL | 0 refills | Status: DC

## 2018-01-04 MED ORDER — TRAZODONE HCL 50 MG PO TABS: 50.0000 mg | ORAL_TABLET | Freq: Every day | ORAL | 1 refills | Status: DC

## 2018-01-04 MED ORDER — TEMAZEPAM 30 MG PO CAPS: ORAL_CAPSULE | ORAL | 0 refills | Status: DC

## 2018-01-04 MED FILL — ALPRAZolam 0.25 MG TABS: 0.25 | 30 days supply | Qty: 30 | Fill #0

## 2018-01-04 MED FILL — TEMAZEPAM 15 MG CAPSULE: 15 | 30 days supply | Qty: 60 | Fill #0

## 2018-01-04 MED FILL — traZODone HCL 50 MG TABS: 50 | 90 days supply | Qty: 90 | Fill #0

## 2018-01-04 NOTE — Patient Instructions (Addendum)
It was very nice to see you again today- please let me know if your cold symptoms do not continue to resolve Do not take the restoril and xanax together due to risk of excessive sedation I will be in touch with your labs asap   Your BP is high today- please monitor this for me at work and send me a few readings in 2-3 weeks

## 2018-02-20 ENCOUNTER — Other Ambulatory Visit: Payer: Self-pay | Admitting: Family Medicine

## 2018-02-20 DIAGNOSIS — G4726 Circadian rhythm sleep disorder, shift work type: Secondary | ICD-10-CM

## 2018-02-22 MED FILL — TEMAZEPAM 15 MG CAPSULE: 15 | 30 days supply | Qty: 60 | Fill #0

## 2018-02-22 NOTE — Telephone Encounter (Signed)
Pt requesting refill on temazepam (RESTORIL) 30 MG capsule. Last office visit 01/04/18 and last refill 01/04/18. Please advise.

## 2018-04-04 NOTE — Progress Notes (Signed)
[facility] [address], [place], [place] [address] [phone]  Date:  04/05/2018   Name:  Leslie Wiggins   DOB:  [birth year]   MRN:  [id number]  PCP:  Darreld Mclean, MD    Chief Complaint: Leg Pain (Pt reports left leg pain x 2 weeks. Has a lot of muscle cramping.)   History of Present Illness:  Leslie Wiggins is a 47 y.o. 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  She has been using braces at night to prevent" foot drop"- she does not have foot drop per se., but notes that when her feet and ankles extend she will have cramping and pain. She wears soft braces to hold her ankle at neurtral which helps  She works as a Energy manager.  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  This is causing more problems with her feet and legs  She knows that losing weight will help her here, and has to try and lose weight anyway for new health metric requirements.  She is interested in trying weight  watches which I encouraged   Patient Active Problem List   Diagnosis Date Noted  . 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 Obesity 02/26/2016    Past Medical History:  Diagnosis Date  . Depression   . Hay fever     Past Surgical History:  Procedure Laterality Date  . WISDOM TOOTH EXTRACTION  1988  . WISDOM TOOTH EXTRACTION      Social History   Tobacco Use  . Smoking status: Former Research scientist (life sciences)  . Smokeless tobacco: Never Used  Substance Use Topics  . Alcohol use: No    Alcohol/week: 0.0 oz  . Drug use: No    Family History  Problem Relation Age of Onset  . Breast cancer Mother   . Hypertension Mother   . Hypertension Father   . Tongue cancer Father     Allergies  Allergen Reactions  . Pollen Extract   . Tape Dermatitis, Itching and Rash    Medication list has been reviewed and updated.  Current Outpatient Medications on File Prior to Visit  Medication Sig Dispense Refill  . ALPRAZolam (XANAX) 0.25 MG tablet Take 1/2 or 1 once daily as needed for anxiety 30 tablet 0  . doxycycline (VIBRAMYCIN) 100 MG capsule Take 1 capsule (100 mg total) by mouth 2 (two) times daily.  Take with food. 14 capsule 0  . temazepam (RESTORIL) 15 MG capsule TAKE 2 CAPSULES (30 MG) BY MOUTH AT BEDTIME AS NEEDED FOR SLEEP 60 capsule 1  . traZODone (DESYREL) 50 MG tablet Take 1 tablet (50 mg total) by mouth at bedtime. 90 tablet 1   No current facility-administered medications on file prior to visit.     Review of Systems:  As per HPI- otherwise negative. No fever or chills Wt Readings from Last 3 Encounters:  04/05/18 (!) 310 lb (140.6 kg)  01/04/18 (!) 311 lb (141.1 kg)  08/15/17 (!) 318 lb (144.2 kg)   Plantar fascia sx in the left foot only   Physical Examination: Vitals:   04/05/18 1257  BP: (!) 142/82  Pulse: 86  Resp: 20  Temp: (!) 97.5 F (36.4 C)  SpO2: 96%   Vitals:   04/05/18 1257  Weight: (!) 310 lb (140.6 kg)  Height: 5\' 6"  (1.676 m)   Body mass index is 50.04  kg/m. Ideal Body Weight: Weight in (lb) to have BMI = 25: 154.6  GEN: WDWN, NAD, Non-toxic, A & O x 3, obese, otherwise looks well  HEENT: Atraumatic, Normocephalic. Neck supple. No masses, No LAD. Ears and Nose: No external deformity. CV: RRR, No M/G/R. No JVD. No thrill. No extra heart sounds. PULM: CTA B, no wheezes, crackles, rhonchi. No retractions. No resp. distress. No accessory muscle use. EXTR: No c/c/e NEURO Normal gait.  PSYCH: Normally interactive. Conversant. Not depressed or anxious appearing.  Calm demeanor.  Left foot: she has tenderness over the plantar aspect of the calcaneous very typical of plantar fascitis.  Also, both ankles show normal strength to flexion and extension and she does not display foot drop while walking No swelling., redness or heat in either ankle or foot.  She notes tenderness in the musculature of the lateral left calf     Assessment and Plan: Pain of joint of left ankle and foot - Plan: Ambulatory referral to Sports Medicine  Plantar fasciitis  Obesity without serious comorbidity, unspecified classification, unspecified obesity type  Referral to sports med regarding her more chronic gait, foot and ankle issues.  Perhaps bracing or insoles might be helpful Given a note for her job Discussed PF and measures which may be helpful Encouraged weight loss- she will work on this   Signed Lamar Blinks, MD

## 2018-04-05 ENCOUNTER — Ambulatory Visit: Payer: 59 | Admitting: Family Medicine

## 2018-04-05 ENCOUNTER — Encounter: Payer: Self-pay | Admitting: Family Medicine

## 2018-04-05 VITALS — BP 142/82 | HR 86 | Temp 97.5°F | Resp 20 | Ht 66.0 in | Wt 310.0 lb

## 2018-04-05 DIAGNOSIS — M25572 Pain in left ankle and joints of left foot: Secondary | ICD-10-CM

## 2018-04-05 DIAGNOSIS — E669 Obesity, unspecified: Secondary | ICD-10-CM | POA: Diagnosis not present

## 2018-04-05 DIAGNOSIS — M722 Plantar fascial fibromatosis: Secondary | ICD-10-CM

## 2018-04-05 NOTE — Patient Instructions (Signed)
Good to see you today- I am going to have Dr. Barbaraann Barthel take a look at your feet and ankles.  For your foot, stretching before you get up in the morning and ice/ ice massage can be helpful  I agree that weight loss will be helpful in reducing pressure on your feet and legs.  Weight watchers can be a good method to aid in weight loss.    Please let me know if you need anything further

## 2018-04-10 ENCOUNTER — Encounter: Payer: Self-pay | Admitting: Family Medicine

## 2018-04-13 ENCOUNTER — Ambulatory Visit: Payer: 59 | Admitting: Family Medicine

## 2018-04-13 ENCOUNTER — Encounter: Payer: Self-pay | Admitting: Family Medicine

## 2018-04-13 ENCOUNTER — Telehealth: Payer: Self-pay | Admitting: Family Medicine

## 2018-04-13 DIAGNOSIS — M79672 Pain in left foot: Secondary | ICD-10-CM

## 2018-04-13 NOTE — Progress Notes (Addendum)
PCP and consultation requested by: Copland, Gay Filler, MD  Subjective:   HPI: Patient is a 47 y.o. female with medical history significant for obesity who is here for a 1 month history of left foot pain. She does not recall any inciting event for the onset of pain. She describes the pain as a "hot, pins and needles" feeling worse in the medial and lateral heel with occasional spread to the lateral plantar foot. It is worse in the mornings and with extended periods of walking. However, she has constant 3/10 pain even without walking. She presumed that it was plantar fasciitis and began icing her feet daily, wearing night splints, rolling the arch with a tennis ball, purchasing new shoes, and using shoe inserts with arch support. She also takes 600mg  ibuprofen 2x/day. She has noticed mild improvement during the past two days, as she was able to walk barefoot for the first time in the past month without severe heel pain. She has never had anything like this before. She denies similar symptoms in the right foot, though both legs tend to cramp at night, which is why she began wearing bilateral night splints. She denies any trauma. She endorses swelling in the back of the heel after activity, but denies any erythema or bruising of the skin.   ROS: See HPI.  PMH reviewed with patient.  Occupation: Nurse on psychiatric ward.    Past Medical History:  Diagnosis Date  . Depression   . Hay fever     Current Outpatient Medications on File Prior to Visit  Medication Sig Dispense Refill  . ALPRAZolam (XANAX) 0.25 MG tablet Take 1/2 or 1 once daily as needed for anxiety 30 tablet 0  . doxycycline (VIBRAMYCIN) 100 MG capsule Take 1 capsule (100 mg total) by mouth 2 (two) times daily. Take with food. 14 capsule 0  . temazepam (RESTORIL) 15 MG capsule TAKE 2 CAPSULES (30 MG) BY MOUTH AT BEDTIME AS NEEDED FOR SLEEP 60 capsule 1  . traZODone (DESYREL) 50 MG tablet Take 1 tablet (50 mg total) by mouth at  bedtime. 90 tablet 1   No current facility-administered medications on file prior to visit.     Past Surgical History:  Procedure Laterality Date  . WISDOM TOOTH EXTRACTION  1988  . WISDOM TOOTH EXTRACTION      Allergies  Allergen Reactions  . Pollen Extract   . Tape Dermatitis, Itching and Rash    Social History   Socioeconomic History  . Marital status: Single    Spouse name: Not on file  . Number of children: Not on file  . Years of education: Not on file  . Highest education level: Not on file  Occupational History  . Occupation: Programmer, multimedia: [employer]  . Financial resource strain: Not on file  . Food insecurity:    Worry: Not on file    Inability: Not on file  . Transportation needs:    Medical: Not on file    Non-medical: Not on file  Tobacco Use  . Smoking status: Former Research scientist (life sciences)  . Smokeless tobacco: Never Used  Substance and Sexual Activity  . Alcohol use: No    Alcohol/week: 0.0 oz  . Drug use: No  . Sexual activity: Not on file  Lifestyle  . Physical activity:    Days per week: Not on file    Minutes per session: Not on file  . Stress: Not on file  Relationships  . Social  connections:    Talks on phone: Not on file    Gets together: Not on file    Attends religious service: Not on file    Active member of club or organization: Not on file    Attends meetings of clubs or organizations: Not on file    Relationship status: Not on file  . Intimate partner violence:    Fear of current or ex partner: Not on file    Emotionally abused: Not on file    Physically abused: Not on file    Forced sexual activity: Not on file  Other Topics Concern  . Not on file  Social History Narrative  . Not on file    Family History  Problem Relation Age of Onset  . Breast cancer Mother   . Hypertension Mother   . Hypertension Father   . Tongue cancer Father     BP (!) 146/95   Pulse 96   Ht 5\' 6"  (1.676 m)   Wt 300 lb (136.1 kg)   BMI  48.42 kg/m       Objective:  Physical Exam:  Gen: NAD, comfortable in exam room CV: warm-well perfused Pulm: non-labored breathing Left ankle/foot: No deformity or swelling appreciated on inspection. Significant tenderness with heel squeeze. No tenderness with palpation of achilles insertion on calcaneus or on the plantar surface of the calcaneus at the fascial insertion. No tenderness along plantar fascia. No tenderness of bony prominences of forefoot. Full ROM with plantarflexion and internal/external rotation. Only 0 - 5 degrees of dorsiflexion. 5/5 strength. Pain with resisted external rotation. No laxity with talar tilt or anterior drawer. NVI distally. Right ankle/foot: Prominent lateral malleolus from prior injury but no additional deformity. FROM with 5/5 strength. No tenderness to palpation. NVI distally.     Assessment & Plan:  1. Left heel pain: Patient presents with a 1 month history of heel pain predominantly aggravated by heel squeeze on exam. Location of pain raises concern for calcaneal stress fracture. Lack of pain at fascial insertion on the plantar surface of calcaneus or along the plantar fascia makes plantar fasciitis less likely. However, she may have atypical location of plantar fascial pain. Will proceed with management of stress fracture and monitor for improvement. Patient placed in short CAM walker with her orthotics and was instructed to provide update in 2 weeks. Will re-assess at that time.  Tylenol and/or aleve only if needed.  Icing.  Shown motion exercises to start as tolerated and as long as not worsening her pain.  Patient seen and examined with medical student.  Agree with his note, plan.

## 2018-04-13 NOTE — Patient Instructions (Signed)
Your exam and location of pain suggest a calcaneal stress fracture more than plantar fasciitis/achilles tendinitis. Wear the boot with orthotic when up and walking around. Take tylenol and/or aleve only if needed for pain  Plantar fascia stretch for 20-30 seconds (do 3 of these) in morning Lowering/raise on a step exercises 3 x 10 once or twice a day - this is very important for long term recovery. Ice heel for 15 minutes as needed. Avoid flat shoes/barefoot walking as much as possible. Physical therapy is also an option. Follow up with me in 6 weeks but call me in 2 weeks to let me know how you're doing.

## 2018-04-13 NOTE — Telephone Encounter (Signed)
Patient dropped off paperwork, Placed in front office tray, Please fax when done and call to pick up original.

## 2018-04-13 NOTE — Assessment & Plan Note (Signed)
Patient presents with a 1 month history of heel pain predominantly aggravated by heel squeeze on exam. Location of pain raises concern for calcaneal stress fracture. Lack of pain at fascial insertion on the plantar surface of calcaneus or along the plantar fascia makes plantar fasciitis less likely. However, Leslie Wiggins may have atypical location of plantar fascial pain. Will proceed with management of stress fracture and monitor for improvement. Patient placed in short CAM walker with her orthotics and was instructed to provide update in 2 weeks. Will re-assess at that time.  Tylenol and/or aleve only if needed.  Icing.  Shown motion exercises to start as tolerated and as long as not worsening her pain.

## 2018-04-17 NOTE — Telephone Encounter (Signed)
Patient calling back and wants to be make sure there was a release form in the paper, if not form can not be faxed. Please advise. Call back [phone]

## 2018-04-24 NOTE — Telephone Encounter (Signed)
Started paperwork on Wednesday, 04/19/18, but need to converse with provider who was out of office on Thursday [and myself] and Friday, 04/20/18 & 04/21/18. Will speak to PCP provider today to get paperwork for FMLA & STD completed and faxed today/SLS 05/20  Completed as much as possible; forwarded FMLA, STD & Accommodation Request paperwork to provider/SLS 05/20

## 2018-04-25 MED FILL — HYDROCODON-APAP 5-325: 5-325 | 2 days supply | Qty: 15 | Fill #0

## 2018-04-25 MED FILL — PENICILLIN VK 500 MG TABLET: 500 | 7 days supply | Qty: 28 | Fill #0

## 2018-04-25 NOTE — Telephone Encounter (Signed)
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

## 2018-04-25 NOTE — Telephone Encounter (Signed)
Patient checking status, paperwork must be completed today. Call back [phone]

## 2018-04-26 ENCOUNTER — Encounter: Payer: Self-pay | Admitting: Family Medicine

## 2018-04-26 NOTE — Telephone Encounter (Signed)
Called pt to go over her paperwork- however she reports that all is taken care of already- perhaps done by Dr. Barbaraann Barthel. She does not need anything Will hang onto papers however for a while to make sure

## 2018-04-27 ENCOUNTER — Telehealth: Payer: Self-pay | Admitting: Family Medicine

## 2018-04-27 NOTE — Telephone Encounter (Signed)
It sounds like it's improving as expected.  I'd tell her to continue with current treatment, plan to see her back in 4 weeks as scheduled and call us with any questions or concerns in the interim.  Thanks!

## 2018-04-27 NOTE — Telephone Encounter (Signed)
Patient calling to give update on left heel. She states that it is itching so she expects that it is in the process of healing. She is keeping up with directions, still wearing the boot and trying to stay off of her left foot as much as possible.

## 2018-05-04 NOTE — Telephone Encounter (Signed)
Patient was informed on 5/23

## 2018-05-05 NOTE — Telephone Encounter (Unsigned)
Copied from [place] [id number]. 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 Advised it takes 5 to 7 business days. Patient would like a call back to confirm that it was received and find out if she has to pay.

## 2018-05-15 MED FILL — traZODone HCL 50 MG TABS: 50 | 90 days supply | Qty: 90 | Fill #1

## 2018-05-15 MED FILL — TEMAZEPAM 15 MG CAPSULE: 15 | 30 days supply | Qty: 60 | Fill #1

## 2018-05-25 ENCOUNTER — Encounter: Payer: Self-pay | Admitting: Family Medicine

## 2018-05-25 ENCOUNTER — Ambulatory Visit: Payer: 59 | Admitting: Family Medicine

## 2018-05-25 DIAGNOSIS — M79672 Pain in left foot: Secondary | ICD-10-CM | POA: Diagnosis not present

## 2018-05-25 NOTE — Progress Notes (Signed)
PCP: Copland, Gay Filler, MD  Subjective:   HPI: Patient is a 47 y.o. female here for heel pain.  5/9: Patient is a 47 y.o. female with medical history significant for obesity who is here for a 1 month history of left foot pain. She does not recall any inciting event for the onset of pain. She describes the pain as a "hot, pins and needles" feeling worse in the medial and lateral heel with occasional spread to the lateral plantar foot. It is worse in the mornings and with extended periods of walking. However, she has constant 3/10 pain even without walking. She presumed that it was plantar fasciitis and began icing her feet daily, wearing night splints, rolling the arch with a tennis ball, purchasing new shoes, and using shoe inserts with arch support. She also takes 600mg  ibuprofen 2x/day. She has noticed mild improvement during the past two days, as she was able to walk barefoot for the first time in the past month without severe heel pain. She has never had anything like this before. She denies similar symptoms in the right foot, though both legs tend to cramp at night, which is why she began wearing bilateral night splints. She denies any trauma. She endorses swelling in the back of the heel after activity, but denies any erythema or bruising of the skin.   6/20: Patient reports she feels much better. Having mild pain now plantar heel left more than right. Pain level 1/10 and a soreness. Stopped using cam walker about 1 1/2 weeks ago. Not wearing orthotics currently - got new shoes (hoka's). Not taking any medicines for this currently. No skin changes.  Past Medical History:  Diagnosis Date  . Depression   . Hay fever     Current Outpatient Medications on File Prior to Visit  Medication Sig Dispense Refill  . ALPRAZolam (XANAX) 0.25 MG tablet Take 1/2 or 1 once daily as needed for anxiety 30 tablet 0  . doxycycline (VIBRAMYCIN) 100 MG capsule Take 1 capsule (100 mg total) by mouth 2  (two) times daily. Take with food. 14 capsule 0  . temazepam (RESTORIL) 15 MG capsule TAKE 2 CAPSULES (30 MG) BY MOUTH AT BEDTIME AS NEEDED FOR SLEEP 60 capsule 1  . traZODone (DESYREL) 50 MG tablet Take 1 tablet (50 mg total) by mouth at bedtime. 90 tablet 1   No current facility-administered medications on file prior to visit.     Past Surgical History:  Procedure Laterality Date  . WISDOM TOOTH EXTRACTION  1988  . WISDOM TOOTH EXTRACTION      Allergies  Allergen Reactions  . Pollen Extract   . Tape Dermatitis, Itching and Rash    Social History   Socioeconomic History  . Marital status: Single    Spouse name: Not on file  . Number of children: Not on file  . Years of education: Not on file  . Highest education level: Not on file  Occupational History  . Occupation: Programmer, multimedia: [employer]  . Financial resource strain: Not on file  . Food insecurity:    Worry: Not on file    Inability: Not on file  . Transportation needs:    Medical: Not on file    Non-medical: Not on file  Tobacco Use  . Smoking status: Former Research scientist (life sciences)  . Smokeless tobacco: Never Used  Substance and Sexual Activity  . Alcohol use: No    Alcohol/week: 0.0 oz  . Drug use:  No  . Sexual activity: Not on file  Lifestyle  . Physical activity:    Days per week: Not on file    Minutes per session: Not on file  . Stress: Not on file  Relationships  . Social connections:    Talks on phone: Not on file    Gets together: Not on file    Attends religious service: Not on file    Active member of club or organization: Not on file    Attends meetings of clubs or organizations: Not on file    Relationship status: Not on file  . Intimate partner violence:    Fear of current or ex partner: Not on file    Emotionally abused: Not on file    Physically abused: Not on file    Forced sexual activity: Not on file  Other Topics Concern  . Not on file  Social History Narrative  . Not on file     Family History  Problem Relation Age of Onset  . Breast cancer Mother   . Hypertension Mother   . Hypertension Father   . Tongue cancer Father     BP (!) 158/102   Pulse 95   Ht 5\' 6"  (1.676 m)   Wt 300 lb (136.1 kg)   BMI 48.42 kg/m   Review of Systems: See HPI above.     Objective:  Physical Exam:  Gen: NAD, comfortable in exam room  Left foot/ankle: No gross deformity, swelling, ecchymoses FROM with 5/5 strength. TTP plantar fascia insertion on medial calcaneus. Negative ant drawer and talar tilt.   Negative syndesmotic compression. Negative calcaneal squeeze. Thompsons test negative. NV intact distally.   Assessment & Plan:  1. Left heel pain - calcaneal stress fracture healed.  Now with classic plantar fasciitis.  Shown home exercises and stretches to do daily.  Continue with her new shoes - if worsening add her orthotics to these.  Arch binders.  Consider physical therapy, injection if not improving as expected also.  F/u prn.

## 2018-05-25 NOTE — Patient Instructions (Signed)
The stress fracture has healed at this point. You have plantar fasciitis Take tylenol and/or aleve as needed for pain  Plantar fascia stretch for 20-30 seconds (do 3 of these) in morning Lowering/raise on a step exercises 3 x 10 once or twice a day - this is very important for long term recovery. Ice heel for 15 minutes as needed. Avoid flat shoes/barefoot walking as much as possible. Arch straps have been shown to help with pain. Try the shoes without the orthotics but if you're struggling add these in there (take the insole out first). Steroid injection is a consideration for short term pain relief if you are struggling. Physical therapy is also an option. Follow up with me as needed.

## 2018-05-25 NOTE — Assessment & Plan Note (Signed)
calcaneal stress fracture healed.  Now with classic plantar fasciitis.  Shown home exercises and stretches to do daily.  Continue with her new shoes - if worsening add her orthotics to these.  Arch binders.  Consider physical therapy, injection if not improving as expected also.  F/u prn.

## 2018-06-10 NOTE — Progress Notes (Signed)
[facility] [facility], [place], [place] [address] [phone]  Date:  06/14/2018   Name:  Leslie Wiggins   DOB:  11-04-1971   MRN:  [id number]  PCP:  Darreld Mclean, MD    Chief Complaint: Annual Exam and Hypertension (lisinopril 20-hctz 12.5 previously 138/103, 05/12/18 129/87, 06/11/18 122 73)   History of Present Illness:  Leslie Wiggins is a 47 y.o. very pleasant female patient who presents with the following:  Here today for a CPE History of obesity, depression Last seen by myself in May when she was having some foot pain, due to change in her job. 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  She is also being careful not to do too much overtime  Pap: 2017- negative, HPV neg.  Plan to repeat next year Mammo: 2017- she will get this done asap Labs:  January  Immun: are UTD per our knowledge  She has been noted to have a mild leukocytosis for a couple of years per our records- has seen hematology who did not feel BmBx was needed at this time   She has noted that her BP has gone up over the last couple of months. She got worried about this and started herself on BP meds She had some left over lisinopril/hct 20/12.5 that she went back on.  She started taking this med about a week ago and so far seems to be tolerating this well  She is going back to school to get her BSN and needs some forms/ immunizations completed.   Went over forms with her.  It looks like she needs a PPD, MMR and varicella titers.  Will draw for her today  States no chance of pregnancy as she is not SA, but LMP was 3 months ago.  She hopes she is starting menopause  Patient Active Problem List   Diagnosis Date Noted  . Pain of left heel 04/13/2018  . 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 Obesity 02/26/2016    Past Medical History:  Diagnosis Date  . Depression   . Hay fever     Past Surgical History:  Procedure Laterality Date  . WISDOM TOOTH EXTRACTION  1988  . WISDOM TOOTH EXTRACTION      Social History   Tobacco Use  . Smoking status: Former Research scientist (life sciences)  . Smokeless tobacco: Never Used  Substance Use Topics  . Alcohol use: No    Alcohol/week: 0.0 oz  . Drug use: No    Family History  Problem Relation Age of Onset  . Breast cancer Mother   . Hypertension Mother   . Hypertension Father   . Tongue cancer Father     Allergies  Allergen Reactions  . Pollen Extract   . Tape Dermatitis, Itching and Rash    Medication list has been reviewed and updated.  Current Outpatient Medications on File Prior to Visit  Medication Sig Dispense Refill  . ALPRAZolam (XANAX) 0.25 MG tablet Take 1/2 or 1 once daily as needed for anxiety 30 tablet 0  . temazepam (RESTORIL) 15 MG capsule TAKE 2 CAPSULES (30 MG) BY MOUTH AT BEDTIME AS NEEDED FOR SLEEP 60 capsule 1  . traZODone (DESYREL) 50 MG tablet Take 1 tablet (50 mg  total) by mouth at bedtime. 90 tablet 1   No current facility-administered medications on file prior to visit.     Review of Systems:  As per HPI- otherwise negative. No fever or chills No CP or SOB    Physical Examination: Vitals:   06/14/18 1331  BP: 126/80  Pulse: (!) 108  Resp: 16  Temp: 97.6 F (36.4 C)  SpO2: 97%   Vitals:   06/14/18 1331  Weight: (!) 305 lb (138.3 kg)  Height: 5' 6"  (1.676 m)   Body mass index is 49.23 kg/m. Ideal Body Weight: Weight in (lb) to have BMI = 25: 154.6  GEN: WDWN, NAD, Non-toxic, A & O x 3, quite obese, looks well otherwise  HEENT: Atraumatic, Normocephalic. Neck supple. No masses, No LAD.  Bilateral TM wnl, oropharynx normal.  PEERL,EOMI.   Ears and Nose: No external deformity. CV: RRR, No M/G/R. No JVD. No thrill. No extra heart sounds. PULM: CTA B, no wheezes, crackles,  rhonchi. No retractions. No resp. distress. No accessory muscle use. EXTR: No c/c/e NEURO Normal gait.  PSYCH: Normally interactive. Conversant. Not depressed or anxious appearing.  Calm demeanor.  She also notes very dry skin on her B hands that can blister and scale.  She is not sure how to manage this   Wt Readings from Last 3 Encounters:  06/14/18 (!) 305 lb (138.3 kg)  05/25/18 300 lb (136.1 kg)  04/13/18 300 lb (136.1 kg)    Assessment and Plan: Medication monitoring encounter - Plan: Basic metabolic panel  Essential hypertension - Plan: lisinopril-hydrochlorothiazide (PRINZIDE,ZESTORETIC) 20-12.5 MG tablet  Immunization deficiency - Plan: Measles/Mumps/Rubella Immunity, Varicella zoster antibody, IgG  Screening for tuberculosis - Plan: PPD  Physical exam  Dyshidrotic eczema - Plan: triamcinolone ointment (KENALOG) 0.5 %  CPE today- labs are UTD Pap UTD Encourage mammo Titers pending as above and did check BMP as she is now on HCTZ. BP looks good today Refilled BP med for her Kenalog prn for her hands Will plan further follow- up pending labs. Will need to complete her forms once titers are in    Signed Lamar Blinks, MD  Received labs for her 7/12 Results for orders placed or performed in visit on 33/00/76  Basic metabolic panel  Result Value Ref Range   Sodium 140 135 - 145 mEq/L   Potassium 4.4 3.5 - 5.1 mEq/L   Chloride 99 96 - 112 mEq/L   CO2 31 19 - 32 mEq/L   Glucose, Bld 97 70 - 99 mg/dL   BUN 15 6 - 23 mg/dL   Creatinine, Ser 0.68 0.40 - 1.20 mg/dL   Calcium 9.9 8.4 - 10.5 mg/dL   GFR 98.30 >60.00 mL/min  Measles/Mumps/Rubella Immunity  Result Value Ref Range   Rubeola IgG >300.00 AU/mL   Mumps IgG 49.80 AU/mL   Rubella 1.44 index  Varicella zoster antibody, IgG  Result Value Ref Range   Varicella IgG 2,922.00 index    Message to pt- she is immune to MMR and varicella   Your metabolic profile is normal, and you are immune to measles,  mumps and rubella, as well as varicella.   According to your notes it looks like you actually could do a quantiferon gold blood test instead of the 2 step TB skin test.  I'll order a quantiferon if you want to have this drawn today so you don't have to come in again!  Also, I don't have a record of a recent tetanus for you.  Can you please call employee health to get this documentation emailed to you and forward to me  addnd 7/15- per my CMA Mel Almond pt came in on Friday to have her PPD read but she was never notified that pt was here and PPD was never read.  Called pt and LMOM- I am so sorry about this, not sure why this happened.  However I will order a quantiferon gold for her to have drawn instead if she would like to come in, pick up her packet and do quantiferon draw at that time. Otherwise her forms are ready and I will leave up front for her

## 2018-06-14 ENCOUNTER — Encounter: Payer: Self-pay | Admitting: Family Medicine

## 2018-06-14 ENCOUNTER — Ambulatory Visit: Payer: 59 | Admitting: Family Medicine

## 2018-06-14 VITALS — BP 126/80 | HR 108 | Temp 97.6°F | Resp 16 | Ht 66.0 in | Wt 305.0 lb

## 2018-06-14 DIAGNOSIS — Z111 Encounter for screening for respiratory tuberculosis: Secondary | ICD-10-CM | POA: Diagnosis not present

## 2018-06-14 DIAGNOSIS — Z0000 Encounter for general adult medical examination without abnormal findings: Secondary | ICD-10-CM

## 2018-06-14 DIAGNOSIS — Z2839 Other underimmunization status: Secondary | ICD-10-CM

## 2018-06-14 DIAGNOSIS — Z5181 Encounter for therapeutic drug level monitoring: Secondary | ICD-10-CM

## 2018-06-14 DIAGNOSIS — Z283 Underimmunization status: Secondary | ICD-10-CM

## 2018-06-14 DIAGNOSIS — L301 Dyshidrosis [pompholyx]: Secondary | ICD-10-CM | POA: Diagnosis not present

## 2018-06-14 DIAGNOSIS — I10 Essential (primary) hypertension: Secondary | ICD-10-CM | POA: Diagnosis not present

## 2018-06-14 MED ORDER — LISINOPRIL-HYDROCHLOROTHIAZIDE 20-12.5 MG PO TABS: 1.0000 | ORAL_TABLET | Freq: Every day | ORAL | 3 refills | Status: DC

## 2018-06-14 MED ORDER — TRIAMCINOLONE ACETONIDE 0.5 % EX OINT: 1.0000 "application " | TOPICAL_OINTMENT | Freq: Two times a day (BID) | CUTANEOUS | 2 refills | Status: DC

## 2018-06-14 MED FILL — LISINOPRIL-HCTZ 20-12.5 TAB: 20-12.5 | 90 days supply | Qty: 90 | Fill #0

## 2018-06-14 MED FILL — TRIAMCINOLONE 0.5% OINTMENT: 0.5 | 30 days supply | Qty: 30 | Fill #0

## 2018-06-14 NOTE — Patient Instructions (Addendum)
Your blood pressure looks good on your current dose of medication we can continue this I will check your BMP today Continue to work on weight loss - gradual is best  Come in for a your TB test reading in 48 hours; you will need another PPD in 2-4 weeks for the 2 step test I will look for immunity to MMR and varicella today- if not immune we will boost you as needed  Health Maintenance, Female Adopting a healthy lifestyle and getting preventive care can go a long way to promote health and wellness. Talk with your health care provider about what schedule of regular examinations is right for you. This is a good chance for you to check in with your provider about disease prevention and staying healthy. In between checkups, there are plenty of things you can do on your own. Experts have done a lot of research about which lifestyle changes and preventive measures are most likely to keep you healthy. Ask your health care provider for more information. Weight and diet Eat a healthy diet  Be sure to include plenty of vegetables, fruits, low-fat dairy products, and lean protein.  Do not eat a lot of foods high in solid fats, added sugars, or salt.  Get regular exercise. This is one of the most important things you can do for your health. ? Most adults should exercise for at least 150 minutes each week. The exercise should increase your heart rate and make you sweat (moderate-intensity exercise). ? Most adults should also do strengthening exercises at least twice a week. This is in addition to the moderate-intensity exercise.  Maintain a healthy weight  Body mass index (BMI) is a measurement that can be used to identify possible weight problems. It estimates body fat based on height and weight. Your health care provider can help determine your BMI and help you achieve or maintain a healthy weight.  For females 64 years of age and older: ? A BMI below 18.5 is considered underweight. ? A BMI of 18.5 to  24.9 is normal. ? A BMI of 25 to 29.9 is considered overweight. ? A BMI of 30 and above is considered obese.  Watch levels of cholesterol and blood lipids  You should start having your blood tested for lipids and cholesterol at 48 years of age, then have this test every 5 years.  You may need to have your cholesterol levels checked more often if: ? Your lipid or cholesterol levels are high. ? You are older than 48 years of age. ? You are at high risk for heart disease.  Cancer screening Lung Cancer  Lung cancer screening is recommended for adults 22-74 years old who are at high risk for lung cancer because of a history of smoking.  A yearly low-dose CT scan of the lungs is recommended for people who: ? Currently smoke. ? Have quit within the past 15 years. ? Have at least a 30-pack-year history of smoking. A pack year is smoking an average of one pack of cigarettes a day for 1 year.  Yearly screening should continue until it has been 15 years since you quit.  Yearly screening should stop if you develop a health problem that would prevent you from having lung cancer treatment.  Breast Cancer  Practice breast self-awareness. This means understanding how your breasts normally appear and feel.  It also means doing regular breast self-exams. Let your health care provider know about any changes, no matter how small.  If you  are in your 20s or 30s, you should have a clinical breast exam (CBE) by a health care provider every 1-3 years as part of a regular health exam.  If you are 4 or older, have a CBE every year. Also consider having a breast X-ray (mammogram) every year.  If you have a family history of breast cancer, talk to your health care provider about genetic screening.  If you are at high risk for breast cancer, talk to your health care provider about having an MRI and a mammogram every year.  Breast cancer gene (BRCA) assessment is recommended for women who have family  members with BRCA-related cancers. BRCA-related cancers include: ? Breast. ? Ovarian. ? Tubal. ? Peritoneal cancers.  Results of the assessment will determine the need for genetic counseling and BRCA1 and BRCA2 testing.  Cervical Cancer Your health care provider may recommend that you be screened regularly for cancer of the pelvic organs (ovaries, uterus, and vagina). This screening involves a pelvic examination, including checking for microscopic changes to the surface of your cervix (Pap test). You may be encouraged to have this screening done every 3 years, beginning at age 21.  For women ages 41-65, health care providers may recommend pelvic exams and Pap testing every 3 years, or they may recommend the Pap and pelvic exam, combined with testing for human papilloma virus (HPV), every 5 years. Some types of HPV increase your risk of cervical cancer. Testing for HPV may also be done on women of any age with unclear Pap test results.  Other health care providers may not recommend any screening for nonpregnant women who are considered low risk for pelvic cancer and who do not have symptoms. Ask your health care provider if a screening pelvic exam is right for you.  If you have had past treatment for cervical cancer or a condition that could lead to cancer, you need Pap tests and screening for cancer for at least 20 years after your treatment. If Pap tests have been discontinued, your risk factors (such as having a new sexual partner) need to be reassessed to determine if screening should resume. Some women have medical problems that increase the chance of getting cervical cancer. In these cases, your health care provider may recommend more frequent screening and Pap tests.  Colorectal Cancer  This type of cancer can be detected and often prevented.  Routine colorectal cancer screening usually begins at 48 years of age and continues through 48 years of age.  Your health care provider may  recommend screening at an earlier age if you have risk factors for colon cancer.  Your health care provider may also recommend using home test kits to check for hidden blood in the stool.  A small camera at the end of a tube can be used to examine your colon directly (sigmoidoscopy or colonoscopy). This is done to check for the earliest forms of colorectal cancer.  Routine screening usually begins at age 29.  Direct examination of the colon should be repeated every 5-10 years through 48 years of age. However, you may need to be screened more often if early forms of precancerous polyps or small growths are found.  Skin Cancer  Check your skin from head to toe regularly.  Tell your health care provider about any new moles or changes in moles, especially if there is a change in a mole's shape or color.  Also tell your health care provider if you have a mole that is larger than  the size of a pencil eraser.  Always use sunscreen. Apply sunscreen liberally and repeatedly throughout the day.  Protect yourself by wearing long sleeves, pants, a wide-brimmed hat, and sunglasses whenever you are outside.  Heart disease, diabetes, and high blood pressure  High blood pressure causes heart disease and increases the risk of stroke. High blood pressure is more likely to develop in: ? People who have blood pressure in the high end of the normal range (130-139/85-89 mm Hg). ? People who are overweight or obese. ? People who are African American.  If you are 81-75 years of age, have your blood pressure checked every 3-5 years. If you are 45 years of age or older, have your blood pressure checked every year. You should have your blood pressure measured twice-once when you are at a hospital or clinic, and once when you are not at a hospital or clinic. Record the average of the two measurements. To check your blood pressure when you are not at a hospital or clinic, you can use: ? An automated blood pressure  machine at a pharmacy. ? A home blood pressure monitor.  If you are between 21 years and 72 years old, ask your health care provider if you should take aspirin to prevent strokes.  Have regular diabetes screenings. This involves taking a blood sample to check your fasting blood sugar level. ? If you are at a normal weight and have a low risk for diabetes, have this test once every three years after 48 years of age. ? If you are overweight and have a high risk for diabetes, consider being tested at a younger age or more often. Preventing infection Hepatitis B  If you have a higher risk for hepatitis B, you should be screened for this virus. You are considered at high risk for hepatitis B if: ? You were born in a country where hepatitis B is common. Ask your health care provider which countries are considered high risk. ? Your parents were born in a high-risk country, and you have not been immunized against hepatitis B (hepatitis B vaccine). ? You have HIV or AIDS. ? You use needles to inject street drugs. ? You live with someone who has hepatitis B. ? You have had sex with someone who has hepatitis B. ? You get hemodialysis treatment. ? You take certain medicines for conditions, including cancer, organ transplantation, and autoimmune conditions.  Hepatitis C  Blood testing is recommended for: ? Everyone born from [birth year] through 1966. ? Anyone with known risk factors for hepatitis C.  Sexually transmitted infections (STIs)  You should be screened for sexually transmitted infections (STIs) including gonorrhea and chlamydia if: ? You are sexually active and are younger than 48 years of age. ? You are older than 48 years of age and your health care provider tells you that you are at risk for this type of infection. ? Your sexual activity has changed since you were last screened and you are at an increased risk for chlamydia or gonorrhea. Ask your health care provider if you are at  risk.  If you do not have HIV, but are at risk, it may be recommended that you take a prescription medicine daily to prevent HIV infection. This is called pre-exposure prophylaxis (PrEP). You are considered at risk if: ? You are sexually active and do not regularly use condoms or know the HIV status of your partner(s). ? You take drugs by injection. ? You are sexually active with a  partner who has HIV.  Talk with your health care provider about whether you are at high risk of being infected with HIV. If you choose to begin PrEP, you should first be tested for HIV. You should then be tested every 3 months for as long as you are taking PrEP. Pregnancy  If you are premenopausal and you may become pregnant, ask your health care provider about preconception counseling.  If you may become pregnant, take 400 to 800 micrograms (mcg) of folic acid every day.  If you want to prevent pregnancy, talk to your health care provider about birth control (contraception). Osteoporosis and menopause  Osteoporosis is a disease in which the bones lose minerals and strength with aging. This can result in serious bone fractures. Your risk for osteoporosis can be identified using a bone density scan.  If you are 68 years of age or older, or if you are at risk for osteoporosis and fractures, ask your health care provider if you should be screened.  Ask your health care provider whether you should take a calcium or vitamin D supplement to lower your risk for osteoporosis.  Menopause may have certain physical symptoms and risks.  Hormone replacement therapy may reduce some of these symptoms and risks. Talk to your health care provider about whether hormone replacement therapy is right for you. Follow these instructions at home:  Schedule regular health, dental, and eye exams.  Stay current with your immunizations.  Do not use any tobacco products including cigarettes, chewing tobacco, or electronic  cigarettes.  If you are pregnant, do not drink alcohol.  If you are breastfeeding, limit how much and how often you drink alcohol.  Limit alcohol intake to no more than 1 drink per day for nonpregnant women. One drink equals 12 ounces of beer, 5 ounces of wine, or 1 ounces of hard liquor.  Do not use street drugs.  Do not share needles.  Ask your health care provider for help if you need support or information about quitting drugs.  Tell your health care provider if you often feel depressed.  Tell your health care provider if you have ever been abused or do not feel safe at home. This information is not intended to replace advice given to you by your health care provider. Make sure you discuss any questions you have with your health care provider. Document Released: 06/07/2011 Document Revised: 04/29/2016 Document Reviewed: 08/26/2015 Elsevier Interactive Patient Education  [employer].

## 2018-06-15 ENCOUNTER — Telehealth: Payer: Self-pay

## 2018-06-15 ENCOUNTER — Other Ambulatory Visit: Payer: Self-pay | Admitting: Family Medicine

## 2018-06-15 DIAGNOSIS — F418 Other specified anxiety disorders: Secondary | ICD-10-CM

## 2018-06-15 LAB — BASIC METABOLIC PANEL
BUN: 15 mg/dL (ref 6–23)
CALCIUM: 9.9 mg/dL (ref 8.4–10.5)
CO2: 31 meq/L (ref 19–32)
Chloride: 99 mEq/L (ref 96–112)
Creatinine, Ser: 0.68 mg/dL (ref 0.40–1.20)
GFR: 98.3 mL/min (ref >60.00)
GLUCOSE: 97 mg/dL (ref 70–99)
Potassium: 4.4 mEq/L (ref 3.5–5.1)
SODIUM: 140 meq/L (ref 135–145)

## 2018-06-15 LAB — MEASLES/MUMPS/RUBELLA IMMUNITY
Mumps IgG: 49.8 AU/mL
Rubella: 1.44 index
Rubeola IgG: >300 AU/mL

## 2018-06-15 LAB — VARICELLA ZOSTER ANTIBODY, IGG: VARICELLA IGG: 2922 {index}

## 2018-06-15 NOTE — Telephone Encounter (Signed)
When she comes in tomorrow to have PPD read we will have to finish the documentation then. I can not put the result in before Friday.

## 2018-06-15 NOTE — Telephone Encounter (Signed)
Ok

## 2018-06-15 NOTE — Telephone Encounter (Signed)
-----   Message from Darreld Mclean, MD sent at 06/14/2018  7:56 PM EDT ----- Hi!  I think we need to finish documentation of PPD.  Thank you!

## 2018-06-16 ENCOUNTER — Ambulatory Visit: Payer: 59

## 2018-06-16 ENCOUNTER — Encounter: Payer: Self-pay | Admitting: Family Medicine

## 2018-06-16 MED FILL — ALPRAZolam 0.25 MG TABS: 0.25 | 30 days supply | Qty: 30 | Fill #0

## 2018-06-16 NOTE — Progress Notes (Signed)
Pt. arrived to waiting room, but was not fully checked in, and author was not made aware by front end that pt. was waiting. Pt. now not in office. Author phoned pt. To follow up and pt stated that she was late to her appointment anyway, and then had to leave, but was not upset, because she realized it would be better to get her titers drawn instead, a month from now, for school purposes. Pt. stated she would make an appointment for later in the month. Routed to Dr. Lorelei Pont as Juluis Rainier.

## 2018-06-16 NOTE — Telephone Encounter (Signed)
Requesting:Alprazolam Contract:04/22/18 HDT:PNSQ, needs uds Last Visit:06/14/18 Next Visit:06/14/18 Last Refill:01/04/18  Please Advise

## 2018-06-20 ENCOUNTER — Encounter: Payer: Self-pay | Admitting: Family Medicine

## 2018-06-28 ENCOUNTER — Other Ambulatory Visit (INDEPENDENT_AMBULATORY_CARE_PROVIDER_SITE_OTHER): Payer: 59

## 2018-06-28 DIAGNOSIS — Z111 Encounter for screening for respiratory tuberculosis: Secondary | ICD-10-CM | POA: Diagnosis not present

## 2018-06-30 LAB — QUANTIFERON-TB GOLD PLUS
Mitogen-NIL: >10 IU/mL
NIL: 0.02 [IU]/mL
QuantiFERON-TB Gold Plus: NEGATIVE
TB1-NIL: 0.01 IU/mL
TB2-NIL: 0.02 IU/mL

## 2018-07-31 ENCOUNTER — Encounter: Payer: Self-pay | Admitting: Family Medicine

## 2018-07-31 DIAGNOSIS — F418 Other specified anxiety disorders: Secondary | ICD-10-CM

## 2018-07-31 DIAGNOSIS — G4726 Circadian rhythm sleep disorder, shift work type: Secondary | ICD-10-CM

## 2018-08-01 DIAGNOSIS — H524 Presbyopia: Secondary | ICD-10-CM | POA: Diagnosis not present

## 2018-08-01 MED ORDER — ALPRAZOLAM 0.25 MG PO TABS: ORAL_TABLET | ORAL | 0 refills | Status: DC

## 2018-08-01 MED ORDER — TRAZODONE HCL 50 MG PO TABS: 50.0000 mg | ORAL_TABLET | Freq: Every day | ORAL | 1 refills | Status: DC

## 2018-08-01 MED ORDER — TEMAZEPAM 15 MG PO CAPS: ORAL_CAPSULE | ORAL | 1 refills | Status: DC

## 2018-08-01 MED FILL — traZODone HCL 50 MG TABS: 50 | 90 days supply | Qty: 90 | Fill #0

## 2018-08-01 MED FILL — ALPRAZolam 0.25 MG TABS: 0.25 | 30 days supply | Qty: 30 | Fill #0

## 2018-08-01 MED FILL — TEMAZEPAM 15 MG CAPSULE: 15 | 30 days supply | Qty: 60 | Fill #0

## 2018-10-12 MED FILL — LISINOPRIL-HCTZ 20-12.5 TAB: 20-12.5 | 90 days supply | Qty: 90 | Fill #1

## 2018-10-16 ENCOUNTER — Telehealth: Payer: Self-pay

## 2018-10-16 NOTE — Telephone Encounter (Signed)
Author received temazepam refill request from [facility] Pharmacist stated that insurance will only allow no more than 45 capsules within 75 day period. Pharmacist recommends either need for PA, or for Dr. Lorelei Pont to change order to 30mg  (pt. Takes two tabs of 15mg   prn HS, last refill 08/01/18, #90, 1RF). Routed to Dr. Lorelei Pont to review.

## 2018-10-17 ENCOUNTER — Encounter: Payer: Self-pay | Admitting: Family Medicine

## 2018-10-17 NOTE — Telephone Encounter (Signed)
I sent message to pt asking if she would be ok with changing to the 30 mg strength

## 2018-10-25 ENCOUNTER — Telehealth: Payer: Self-pay | Admitting: Family Medicine

## 2018-10-25 NOTE — Telephone Encounter (Signed)
Called and LMOM regarding her temazepam- can we change to one 30mg  per day?

## 2018-10-26 ENCOUNTER — Encounter: Payer: Self-pay | Admitting: Family Medicine

## 2018-10-26 DIAGNOSIS — G4726 Circadian rhythm sleep disorder, shift work type: Secondary | ICD-10-CM

## 2018-10-27 MED ORDER — TEMAZEPAM 30 MG PO CAPS: ORAL_CAPSULE | ORAL | 3 refills | Status: DC

## 2018-11-01 DIAGNOSIS — J40 Bronchitis, not specified as acute or chronic: Secondary | ICD-10-CM | POA: Insufficient documentation

## 2018-11-01 DIAGNOSIS — F5101 Primary insomnia: Secondary | ICD-10-CM | POA: Insufficient documentation

## 2019-06-22 DIAGNOSIS — I10 Essential (primary) hypertension: Secondary | ICD-10-CM | POA: Insufficient documentation

## 2021-07-09 DIAGNOSIS — L301 Dyshidrosis [pompholyx]: Secondary | ICD-10-CM | POA: Insufficient documentation

## 2021-11-03 DIAGNOSIS — R4182 Altered mental status, unspecified: Secondary | ICD-10-CM | POA: Diagnosis not present

## 2021-11-03 DIAGNOSIS — Z20822 Contact with and (suspected) exposure to covid-19: Secondary | ICD-10-CM | POA: Diagnosis not present

## 2021-11-03 DIAGNOSIS — R933 Abnormal findings on diagnostic imaging of other parts of digestive tract: Secondary | ICD-10-CM | POA: Diagnosis not present

## 2021-11-03 DIAGNOSIS — A419 Sepsis, unspecified organism: Secondary | ICD-10-CM | POA: Diagnosis not present

## 2021-11-03 DIAGNOSIS — K260 Acute duodenal ulcer with hemorrhage: Secondary | ICD-10-CM | POA: Diagnosis not present

## 2021-11-03 DIAGNOSIS — S098XXA Other specified injuries of head, initial encounter: Secondary | ICD-10-CM | POA: Diagnosis not present

## 2021-11-03 DIAGNOSIS — K209 Esophagitis, unspecified without bleeding: Secondary | ICD-10-CM | POA: Diagnosis not present

## 2021-11-03 DIAGNOSIS — J101 Influenza due to other identified influenza virus with other respiratory manifestations: Secondary | ICD-10-CM | POA: Diagnosis not present

## 2021-11-03 DIAGNOSIS — J9601 Acute respiratory failure with hypoxia: Secondary | ICD-10-CM | POA: Diagnosis not present

## 2021-11-03 DIAGNOSIS — G9389 Other specified disorders of brain: Secondary | ICD-10-CM | POA: Diagnosis not present

## 2021-11-03 DIAGNOSIS — K269 Duodenal ulcer, unspecified as acute or chronic, without hemorrhage or perforation: Secondary | ICD-10-CM | POA: Diagnosis not present

## 2021-11-03 DIAGNOSIS — R936 Abnormal findings on diagnostic imaging of limbs: Secondary | ICD-10-CM | POA: Diagnosis not present

## 2021-11-03 DIAGNOSIS — F1510 Other stimulant abuse, uncomplicated: Secondary | ICD-10-CM | POA: Diagnosis not present

## 2021-11-03 DIAGNOSIS — K2210 Ulcer of esophagus without bleeding: Secondary | ICD-10-CM | POA: Diagnosis not present

## 2021-11-03 DIAGNOSIS — K921 Melena: Secondary | ICD-10-CM | POA: Diagnosis not present

## 2021-11-03 DIAGNOSIS — I10 Essential (primary) hypertension: Secondary | ICD-10-CM | POA: Diagnosis not present

## 2021-11-03 DIAGNOSIS — F1120 Opioid dependence, uncomplicated: Secondary | ICD-10-CM | POA: Diagnosis not present

## 2021-11-03 DIAGNOSIS — S299XXA Unspecified injury of thorax, initial encounter: Secondary | ICD-10-CM | POA: Diagnosis not present

## 2021-11-03 DIAGNOSIS — K922 Gastrointestinal hemorrhage, unspecified: Secondary | ICD-10-CM | POA: Diagnosis not present

## 2021-11-03 DIAGNOSIS — S0990XA Unspecified injury of head, initial encounter: Secondary | ICD-10-CM | POA: Diagnosis not present

## 2021-11-03 DIAGNOSIS — K264 Chronic or unspecified duodenal ulcer with hemorrhage: Secondary | ICD-10-CM | POA: Diagnosis not present

## 2021-11-03 DIAGNOSIS — J111 Influenza due to unidentified influenza virus with other respiratory manifestations: Secondary | ICD-10-CM | POA: Diagnosis not present

## 2021-11-03 DIAGNOSIS — F1910 Other psychoactive substance abuse, uncomplicated: Secondary | ICD-10-CM | POA: Diagnosis not present

## 2021-11-04 DIAGNOSIS — S299XXA Unspecified injury of thorax, initial encounter: Secondary | ICD-10-CM | POA: Diagnosis not present

## 2021-11-04 DIAGNOSIS — R4182 Altered mental status, unspecified: Secondary | ICD-10-CM | POA: Diagnosis not present

## 2021-11-04 DIAGNOSIS — J101 Influenza due to other identified influenza virus with other respiratory manifestations: Secondary | ICD-10-CM | POA: Diagnosis not present

## 2021-11-04 DIAGNOSIS — J9601 Acute respiratory failure with hypoxia: Secondary | ICD-10-CM | POA: Diagnosis not present

## 2021-11-04 DIAGNOSIS — F1910 Other psychoactive substance abuse, uncomplicated: Secondary | ICD-10-CM | POA: Diagnosis not present

## 2021-11-05 DIAGNOSIS — K264 Chronic or unspecified duodenal ulcer with hemorrhage: Secondary | ICD-10-CM | POA: Diagnosis not present

## 2021-11-05 DIAGNOSIS — K921 Melena: Secondary | ICD-10-CM | POA: Diagnosis not present

## 2021-11-05 DIAGNOSIS — K209 Esophagitis, unspecified without bleeding: Secondary | ICD-10-CM | POA: Diagnosis not present

## 2021-11-05 DIAGNOSIS — K922 Gastrointestinal hemorrhage, unspecified: Secondary | ICD-10-CM | POA: Diagnosis not present

## 2021-11-05 DIAGNOSIS — J101 Influenza due to other identified influenza virus with other respiratory manifestations: Secondary | ICD-10-CM | POA: Diagnosis not present

## 2021-11-05 DIAGNOSIS — K260 Acute duodenal ulcer with hemorrhage: Secondary | ICD-10-CM | POA: Diagnosis not present

## 2021-11-05 DIAGNOSIS — R933 Abnormal findings on diagnostic imaging of other parts of digestive tract: Secondary | ICD-10-CM | POA: Diagnosis not present

## 2021-11-05 DIAGNOSIS — K269 Duodenal ulcer, unspecified as acute or chronic, without hemorrhage or perforation: Secondary | ICD-10-CM | POA: Diagnosis not present

## 2021-11-05 DIAGNOSIS — F1120 Opioid dependence, uncomplicated: Secondary | ICD-10-CM | POA: Diagnosis not present

## 2021-11-05 DIAGNOSIS — F1510 Other stimulant abuse, uncomplicated: Secondary | ICD-10-CM | POA: Diagnosis not present

## 2021-11-06 DIAGNOSIS — K209 Esophagitis, unspecified without bleeding: Secondary | ICD-10-CM | POA: Diagnosis not present

## 2021-11-06 DIAGNOSIS — K922 Gastrointestinal hemorrhage, unspecified: Secondary | ICD-10-CM | POA: Diagnosis not present

## 2021-11-06 DIAGNOSIS — J101 Influenza due to other identified influenza virus with other respiratory manifestations: Secondary | ICD-10-CM | POA: Diagnosis not present

## 2021-11-06 DIAGNOSIS — K264 Chronic or unspecified duodenal ulcer with hemorrhage: Secondary | ICD-10-CM | POA: Diagnosis not present

## 2021-11-07 DIAGNOSIS — J101 Influenza due to other identified influenza virus with other respiratory manifestations: Secondary | ICD-10-CM | POA: Diagnosis not present

## 2021-11-13 ENCOUNTER — Emergency Department (INDEPENDENT_AMBULATORY_CARE_PROVIDER_SITE_OTHER)
Admission: EM | Admit: 2021-11-13 | Discharge: 2021-11-13 | Disposition: A | Discharge status: Home / Self Care | Payer: 59 | Source: Home / Self Care

## 2021-11-13 DIAGNOSIS — M542 Cervicalgia: Secondary | ICD-10-CM | POA: Diagnosis not present

## 2021-11-13 MED ORDER — TIZANIDINE HCL 4 MG PO TABS: 4.0000 mg | ORAL_TABLET | Freq: Four times a day (QID) | ORAL | 0 refills | Status: DC | PRN

## 2021-11-13 MED ORDER — DICLOFENAC SODIUM 75 MG PO TBEC: 75.0000 mg | DELAYED_RELEASE_TABLET | Freq: Two times a day (BID) | ORAL | 0 refills | Status: AC

## 2021-11-13 NOTE — Discharge Instructions (Addendum)
Take anti-inflammatory twice daily as prescribed with food. Take muscle relaxer on an as-needed basis, do not take at the same time as your sleeping medications to avoid excessive sedation. Get a deep tissue or trigger point massage, ischemic release type Purchase a microwavable moist heat pack and apply to neck as needed Return to clinic for follow-up or recheck in 7 days if not significantly improved

## 2021-11-13 NOTE — ED Triage Notes (Signed)
Pt c/o RT shoulder pain and neck pain. Says she woke up with the inability to lift her RT arm. Says she felt like it was dislocated. Pain 4/10, motrin and norco prn.

## 2021-11-13 NOTE — ED Provider Notes (Signed)
[facility]    CSN: [id number] Arrival date & time: 11/13/21  1709      History   Chief Complaint Chief Complaint  Patient presents with   Shoulder Pain    RT   Neck Pain    HPI Leslie Wiggins is a 50 y.o. female.   Patient presents today with acute onset of right shoulder and neck pain.  She states she takes as needed sleeping medication and woke up 1 morning a week ago after taking a trazodone and Restoril.  She reported that she was unable to move her shoulder, but was able to move her forearm and wrist.  She states she took it easy that day, iced her shoulder, and took an anti-inflammatory.  She reports the movement of her arm has resolved back to normal, but there is still significant discomfort to her posterior shoulder and right lateral neck.  She denies any trauma. 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  She denies any radicular symptoms or weakness of the arms today, but states that any motion of her neck seems to cause a pulling sensation in her shoulder.  She admits to similar symptoms in the past for which massage has helped.  She has not tried any additional over-the-counter medications or treatments.   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      Home Medications    Prior to Admission medications   Medication Sig Start Date End Date Taking? Authorizing Provider  diclofenac (VOLTAREN) 75 MG EC tablet Take 1 tablet (75  mg total) by mouth 2 (two) times daily for 10 days. 11/13/21 11/23/21 Yes Punam Broussard L, PA  tiZANidine (ZANAFLEX) 4 MG tablet Take 1 tablet (4 mg total) by mouth every 6 (six) hours as needed for muscle spasms. 11/13/21  Yes Ewa Hipp L, PA  ALPRAZolam (XANAX) 0.25 MG tablet TAKE 1/2 OR 1 TABLET BY MOUTH ONCE DAILY AS NEEDED FOR ANXIETY 08/01/18   Copland, Gay Filler, MD  losartan (COZAAR) 100 MG tablet  09/17/21   [provider]  temazepam (RESTORIL) 30 MG capsule TAKE 1 CAPSULE (30 MG) BY MOUTH AT BEDTIME AS NEEDED FOR SLEEP 10/27/18   Copland, Gay Filler, MD  traZODone (DESYREL) 50 MG tablet Take 1 tablet (50 mg total) by mouth at bedtime. 08/01/18   Copland, Gay Filler, MD  triamcinolone ointment (KENALOG) 0.5 % Apply 1 application topically 2 (two) times daily. Apply to palms in a pulsed fashion when symptomatic 06/14/18   Copland, Gay Filler, MD    Family History Family History  Problem Relation Age of Onset   Breast cancer Mother    Hypertension Mother    Hypertension Father    Tongue cancer Father     Social History Social History   Tobacco Use   Smoking status: Former   Smokeless tobacco: Never  Substance  Use Topics   Alcohol use: No    Alcohol/week: 0.0 standard drinks   Drug use: No     Allergies   Pollen extract, Lisinopril, and Tape   Review of Systems Review of Systems  Constitutional:  Negative for chills, fatigue and fever.  HENT:  Negative for congestion, ear discharge, ear pain, rhinorrhea, sinus pressure and sinus pain.   Respiratory:  Negative for shortness of breath.   Cardiovascular:  Negative for chest pain.  Musculoskeletal:  Positive for myalgias and neck pain. Negative for neck stiffness.  Skin:  Negative for rash.  Neurological:  Negative for dizziness and headaches.  All other systems reviewed and are negative.   Physical Exam Triage Vital Signs ED Triage Vitals  Enc Vitals Group     BP 11/13/21 1718 (!) 143/87     Pulse Rate  11/13/21 1718 85     Resp 11/13/21 1718 16     Temp 11/13/21 1718 98.5 F (36.9 C)     Temp Source 11/13/21 1718 Oral     SpO2 11/13/21 1718 95 %     Weight --      Height --      Head Circumference --      Peak Flow --      Pain Score 11/13/21 1720 4     Pain Loc --      Pain Edu? --      Excl. in [place]? --    No data found.  Updated Vital Signs BP (!) 143/87 (BP Location: Left Arm)   Pulse 85   Temp 98.5 F (36.9 C) (Oral)   Resp 16   SpO2 95%   Visual Acuity Right Eye Distance:   Left Eye Distance:   Bilateral Distance:    Right Eye Near:   Left Eye Near:    Bilateral Near:     Physical Exam Constitutional:      Appearance: She is obese.  HENT:     Head: Normocephalic and atraumatic.     Mouth/Throat:     Mouth: Mucous membranes are moist.  Cardiovascular:     Rate and Rhythm: Normal rate and regular rhythm.  Pulmonary:     Effort: Pulmonary effort is normal.     Breath sounds: Normal breath sounds.  Musculoskeletal:        General: Normal range of motion.     Right shoulder: Tenderness (reproducible tenderness to trigger point over R trap near scapular spine) present. No swelling, deformity, effusion, bony tenderness or crepitus. Normal range of motion. Normal strength. Normal pulse.     Left shoulder: Normal.     Right upper arm: Normal. No swelling or edema.     Left upper arm: Normal.     Right elbow: Normal.     Left elbow: Normal.     Comments: FROM shoulder Decreased ROM of neck secondary to pain only No crepitus No step off deformity or tenderness to palpation of the spine. Palpable tender not to the mid trapezius on the right with radiation to occipital region and shoulder.  Minimal tenderness to scapular spine. Shoulder and neck exam otherwise normal  Neurological:     Mental Status: She is alert.     UC Treatments / Results  Labs (all labs ordered are listed, but only abnormal results are displayed) Labs Reviewed - No data to  display  EKG   Radiology No results found.  Procedures Procedures (including critical care time)  Medications Ordered in UC Medications -  No data to display  Initial Impression / Assessment and Plan / UC Course  I have reviewed the triage vital signs and the nursing notes.  Pertinent labs & imaging results that were available during my care of the patient were reviewed by me and considered in my medical decision making (see chart for details).     Trigger point with neck/ shoulder pain -moist heat, ischemic release massage, NSAID and muscle relaxer as discussed.  Final Clinical Impressions(s) / UC Diagnoses   Final diagnoses:  Trigger point with neck pain  Myofascial neck pain     Discharge Instructions      Take anti-inflammatory twice daily as prescribed with food. Take muscle relaxer on an as-needed basis, do not take at the same time as your sleeping medications to avoid excessive sedation. Get a deep tissue or trigger point massage, ischemic release type Purchase a microwavable moist heat pack and apply to neck as needed Return to clinic for follow-up or recheck in 7 days if not significantly improved   ED Prescriptions     Medication Sig Dispense Auth. Provider   tiZANidine (ZANAFLEX) 4 MG tablet Take 1 tablet (4 mg total) by mouth every 6 (six) hours as needed for muscle spasms. 30 tablet Amoreena Neubert L, PA   diclofenac (VOLTAREN) 75 MG EC tablet Take 1 tablet (75 mg total) by mouth 2 (two) times daily for 10 days. 20 tablet Apollo Timothy L, [place]      PDMP not reviewed this encounter.   Chaney Malling, [place] 11/13/21 1900

## 2021-12-02 ENCOUNTER — Encounter: Payer: Self-pay | Admitting: Medical-Surgical

## 2021-12-02 ENCOUNTER — Other Ambulatory Visit: Payer: Self-pay

## 2021-12-02 ENCOUNTER — Ambulatory Visit: Payer: 59 | Admitting: Medical-Surgical

## 2021-12-02 VITALS — BP 122/72 | HR 71 | Resp 20 | Ht 66.0 in | Wt 242.1 lb

## 2021-12-02 DIAGNOSIS — F4320 Adjustment disorder, unspecified: Secondary | ICD-10-CM | POA: Diagnosis not present

## 2021-12-02 DIAGNOSIS — F99 Mental disorder, not otherwise specified: Secondary | ICD-10-CM | POA: Diagnosis not present

## 2021-12-02 DIAGNOSIS — F5105 Insomnia due to other mental disorder: Secondary | ICD-10-CM | POA: Diagnosis not present

## 2021-12-02 DIAGNOSIS — I10 Essential (primary) hypertension: Secondary | ICD-10-CM

## 2021-12-02 DIAGNOSIS — Z7689 Persons encountering health services in other specified circumstances: Secondary | ICD-10-CM

## 2021-12-02 DIAGNOSIS — F418 Other specified anxiety disorders: Secondary | ICD-10-CM | POA: Diagnosis not present
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
## 2021-12-11 ENCOUNTER — Other Ambulatory Visit (HOSPITAL_COMMUNITY): Payer: Self-pay

## 2021-12-11 DIAGNOSIS — L301 Dyshidrosis [pompholyx]: Secondary | ICD-10-CM | POA: Diagnosis not present

## 2021-12-11 DIAGNOSIS — Z23 Encounter for immunization: Secondary | ICD-10-CM | POA: Diagnosis not present

## 2021-12-11 MED ORDER — CLOBETASOL PROPIONATE 0.05 % EX OINT
TOPICAL_OINTMENT | CUTANEOUS | 0 refills | Status: DC
  Filled 2021-12-11: qty 60, 14d supply, fill #0

## 2021-12-18 ENCOUNTER — Encounter: Payer: Self-pay | Admitting: Medical-Surgical

## 2021-12-18 NOTE — Telephone Encounter (Signed)
Needs appointment

## 2021-12-23 ENCOUNTER — Ambulatory Visit (INDEPENDENT_AMBULATORY_CARE_PROVIDER_SITE_OTHER): Payer: 59

## 2021-12-23 ENCOUNTER — Other Ambulatory Visit: Payer: Self-pay | Admitting: Family Medicine

## 2021-12-23 ENCOUNTER — Ambulatory Visit: Payer: 59 | Admitting: Sports Medicine

## 2021-12-23 ENCOUNTER — Other Ambulatory Visit (HOSPITAL_COMMUNITY): Payer: Self-pay

## 2021-12-23 ENCOUNTER — Other Ambulatory Visit: Payer: Self-pay

## 2021-12-23 ENCOUNTER — Encounter: Payer: Self-pay | Admitting: Medical-Surgical

## 2021-12-23 DIAGNOSIS — M25571 Pain in right ankle and joints of right foot: Secondary | ICD-10-CM

## 2021-12-23 DIAGNOSIS — G8929 Other chronic pain: Secondary | ICD-10-CM | POA: Diagnosis not present

## 2021-12-23 DIAGNOSIS — M542 Cervicalgia: Secondary | ICD-10-CM

## 2021-12-23 DIAGNOSIS — M25471 Effusion, right ankle: Secondary | ICD-10-CM | POA: Diagnosis not present

## 2021-12-23 DIAGNOSIS — M7989 Other specified soft tissue disorders: Secondary | ICD-10-CM | POA: Diagnosis not present

## 2021-12-23 DIAGNOSIS — S8261XA Displaced fracture of lateral malleolus of right fibula, initial encounter for closed fracture: Secondary | ICD-10-CM

## 2021-12-23 DIAGNOSIS — F418 Other specified anxiety disorders: Secondary | ICD-10-CM

## 2021-12-23 DIAGNOSIS — M5412 Radiculopathy, cervical region: Secondary | ICD-10-CM

## 2021-12-23 MED ORDER — MELOXICAM 15 MG PO TABS
ORAL_TABLET | ORAL | 3 refills | Status: DC
  Filled 2021-12-23: qty 30, 30d supply, fill #0

## 2021-12-23 MED ORDER — CLOBETASOL PROPIONATE 0.05 % EX OINT
TOPICAL_OINTMENT | CUTANEOUS | 0 refills | Status: DC
  Filled 2021-12-23: qty 60, 14d supply, fill #0

## 2021-12-23 MED ORDER — PREGABALIN 50 MG PO CAPS
50.0000 mg | ORAL_CAPSULE | Freq: Two times a day (BID) | ORAL | 3 refills | Status: DC
  Filled 2021-12-23: qty 60, 30d supply, fill #0

## 2021-12-23 MED ORDER — ALPRAZOLAM 0.25 MG PO TABS
ORAL_TABLET | ORAL | 0 refills | Status: DC
  Filled 2021-12-23: qty 30, 30d supply, fill #0

## 2021-12-23 NOTE — Assessment & Plan Note (Signed)
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 Was seen in urgent care and treated conservatively and has improved to some degree. I do suspect this is a right cervical radiculitis, adding x-rays, formal physical therapy, Lyrica and meloxicam. Return to see me in 6 weeks, MRI for interventional planning if no better.

## 2021-12-23 NOTE — Assessment & Plan Note (Addendum)
Chronic pain laterally at the talar dome for years now.. Moderate swelling. Getting some x-rays, formal PT, return to see me in 6 weeks, MR if no better.  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  We will hold off on physical therapy for now while we treat the fracture.

## 2021-12-23 NOTE — Progress Notes (Addendum)
° ° °  Procedures performed today:    None.  Independent interpretation of notes and tests performed by another provider:   None.  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 Was seen in urgent care and treated conservatively and has improved to some degree. I do suspect this is a right cervical radiculitis, adding x-rays, formal physical therapy, Lyrica and meloxicam. Return to see me in 6 weeks, MRI for interventional planning if no better.  Chronic pain of right ankle Chronic pain laterally at the talar dome for years now.. Moderate swelling. Getting some x-rays, formal PT, return to see me in 6 weeks, MR if no better.  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  We will hold off on physical therapy for now while we treat the fracture.  Fracture of ankle, lateral malleolus, right, closed In addition to the chronic appearing changes in the ankle there is an acute fracture of the right lateral malleolus, I would like to do a cam boot for at least 6 to 8 weeks before considering any additional physical therapy and further treatment of the ankle pain.    ___________________________________________ Gwen Her. Dianah Field, M.D., ABFM., CAQSM. Primary Care and [place] Instructor of [employer] [facility]

## 2021-12-24 ENCOUNTER — Other Ambulatory Visit (HOSPITAL_COMMUNITY): Payer: Self-pay

## 2021-12-24 DIAGNOSIS — S8261XA Displaced fracture of lateral malleolus of right fibula, initial encounter for closed fracture: Secondary | ICD-10-CM | POA: Insufficient documentation

## 2021-12-24 NOTE — Assessment & Plan Note (Signed)
In addition to the chronic appearing changes in the ankle there is an acute fracture of the right lateral malleolus, I would like to do a cam boot for at least 6 to 8 weeks before considering any additional physical therapy and further treatment of the ankle pain.

## 2021-12-29 ENCOUNTER — Other Ambulatory Visit (HOSPITAL_COMMUNITY): Payer: Self-pay

## 2021-12-29 DIAGNOSIS — L403 Pustulosis palmaris et plantaris: Secondary | ICD-10-CM | POA: Diagnosis not present

## 2021-12-29 DIAGNOSIS — Z23 Encounter for immunization: Secondary | ICD-10-CM | POA: Diagnosis not present

## 2021-12-29 MED ORDER — CALCIPOTRIENE 0.005 % EX OINT
TOPICAL_OINTMENT | CUTANEOUS | 1 refills | Status: DC
  Filled 2021-12-29: qty 60, 14d supply, fill #0

## 2022-01-18 ENCOUNTER — Encounter: Payer: Self-pay | Admitting: Medical-Surgical

## 2022-02-05 ENCOUNTER — Ambulatory Visit: Payer: 59 | Attending: Sports Medicine | Admitting: Rehabilitative and Restorative Service Providers"

## 2022-02-05 ENCOUNTER — Other Ambulatory Visit: Payer: Self-pay

## 2022-02-05 ENCOUNTER — Encounter: Payer: Self-pay | Admitting: Rehabilitative and Restorative Service Providers"

## 2022-02-05 DIAGNOSIS — D485 Neoplasm of uncertain behavior of skin: Secondary | ICD-10-CM | POA: Diagnosis not present

## 2022-02-05 DIAGNOSIS — M5412 Radiculopathy, cervical region: Secondary | ICD-10-CM | POA: Insufficient documentation

## 2022-02-05 DIAGNOSIS — R293 Abnormal posture: Secondary | ICD-10-CM | POA: Diagnosis not present

## 2022-02-05 DIAGNOSIS — Z23 Encounter for immunization: Secondary | ICD-10-CM | POA: Diagnosis not present

## 2022-02-05 DIAGNOSIS — R29898 Other symptoms and signs involving the musculoskeletal system: Secondary | ICD-10-CM | POA: Diagnosis not present

## 2022-02-05 DIAGNOSIS — L408 Other psoriasis: Secondary | ICD-10-CM | POA: Diagnosis not present

## 2022-02-05 DIAGNOSIS — L403 Pustulosis palmaris et plantaris: Secondary | ICD-10-CM | POA: Diagnosis not present

## 2022-02-05 DIAGNOSIS — Z5181 Encounter for therapeutic drug level monitoring: Secondary | ICD-10-CM | POA: Diagnosis not present
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
## 2022-02-05 NOTE — Patient Instructions (Signed)
Access Code: [email or web address] ?URL: [email or web address] ?Date: 02/05/2022 ?Prepared by: Gillermo Murdoch ? ?Exercises ?Seated Cervical Retraction - 2 x daily - 7 x weekly - 1-2 sets - 5-10 reps - 10 sec hold ?Seated Scapular Retraction - 2 x daily - 7 x weekly - 1-2 sets - 10 reps - 10 sec hold ?Shoulder External Rotation and Scapular Retraction - 3 x daily - 7 x weekly - 1 sets - 10 reps - 3-5 sec hold ?Shoulder External Rotation in 45 Degrees Abduction - 2 x daily - 7 x weekly - 1-2 sets - 10 reps - 30 sec hold ?Doorway Pec Stretch at 60 Degrees Abduction - 3 x daily - 7 x weekly - 1 sets - 3 reps ?Doorway Pec Stretch at 90 Degrees Abduction - 3 x daily - 7 x weekly - 1 sets - 3 reps - 30 seconds hold ?Doorway Pec Stretch at 120 Degrees Abduction - 3 x daily - 7 x weekly - 1 sets - 3 reps - 30 second hold hold ? ?Patient Education ?Posture and Body Mechanics ?Office Posture ?Trigger Point Dry Needling ?

## 2022-02-09 ENCOUNTER — Encounter: Payer: Self-pay | Admitting: Rehabilitative and Restorative Service Providers"

## 2022-02-09 ENCOUNTER — Ambulatory Visit: Payer: 59 | Admitting: Rehabilitative and Restorative Service Providers"

## 2022-02-09 ENCOUNTER — Other Ambulatory Visit: Payer: Self-pay

## 2022-02-09 DIAGNOSIS — M5412 Radiculopathy, cervical region: Secondary | ICD-10-CM | POA: Diagnosis not present

## 2022-02-09 DIAGNOSIS — R293 Abnormal posture: Secondary | ICD-10-CM

## 2022-02-09 DIAGNOSIS — R29898 Other symptoms and signs involving the musculoskeletal system: Secondary | ICD-10-CM

## 2022-02-09 NOTE — Therapy (Signed)
[place] ?[facility] ?[place] ?[place], [place], [address] ?Phone: [phone]   Fax:  [phone] ? ?Physical Therapy Treatment ? ?Patient Details  ?Name: Leslie Wiggins ?MRN: [id number] ?Date of Birth: October 04, 1971 ?Referring Provider (PT): Dr Dianah Field ? ? ?Encounter Date: 02/09/2022 ? ? PT End of Session - 02/09/22 3903   ? ? Visit Number 2   ? Number of Visits 12   ? Date for PT Re-Evaluation 03/19/22   ? PT Start Time [phone]   ? PT Stop Time 1015   ? PT Time Calculation (min) 38 min   ? Activity Tolerance Patient tolerated treatment well   ? ?  ?  ? ?  ? ? ?Past Medical History:  ?Diagnosis Date  ? Anxiety   ? Depression   ? Hay fever   ? Hypertension   ? ? ?Past Surgical History:  ?Procedure Laterality Date  ? [place] EXTRACTION  1988  ? WISDOM TOOTH EXTRACTION    ? ? ?There were no vitals filed for this visit. ? ? Subjective Assessment - 02/09/22 0939   ? ? Subjective Patient reports that she has done her home exercises twice since she was her for evaluation. She had a biopsy Lt hand to determine what type skin conditioin she is dealing with. worked 12 hours last night and had a strssful night   ? Currently in Pain? No/denies   ? Pain Score 0-No pain   ? Pain Location Neck   ? Pain Orientation Right   ? Pain Descriptors / Indicators Tightness   ? Pain Type Chronic pain   ? ?  ?  ? ?  ? ? ? ? ? ? ? ? ? ? ? ? ? ? ? ? ? ? ? ? [facility] Adult PT Treatment/Exercise - 02/09/22 0001   ? ?  ? Neuro Re-ed   ? Neuro Re-ed Details  working on posture and alignment in standing and sitting with swim noodle   ?  ? Shoulder Exercises: Seated  ? Other Seated Exercises thoracic extension coregeous ball mid back   ?  ? Shoulder Exercises: Standing  ? Other Standing Exercises chin tuck 5 sec x 5 reps; scap squeeze 5 sec x 5 reps; L's x 10; W's x 5 reps   ?  ? Shoulder Exercises: Stretch  ? Other Shoulder Stretches doorway stretch 3 postions 30sec hold x 2 reps each position    ?  ? Manual Therapy  ? Manual therapy comments skilled palpatioin to assess response ro DN and manual work   ? Joint Mobilization cervical and thoracic PA and lateral mobs Grade II/III   ? ?  ?  ? ?  ? ? ? Trigger Point Dry Needling - 02/09/22 0001   ? ? Consent Given? Yes   ? Education Handout Provided Previously provided   ? Other Dry Needling Lt   ? Suboccipitals Response Palpable increased muscle length;Twitch response elicited   ? Scalenes Response Palpable increased muscle length;Twitch reponse elicited   ? Cervical multifidi Response Palpable increased muscle length;Twitch reponse elicited   ? Thoracic multifidi response Palpable increased muscle length;Twitch response elicited   ? ?  ?  ? ?  ? ? ? ? ? ? ? ? ? ? ? ? ? PT Long Term Goals - 02/05/22 1116   ? ?  ? PT LONG TERM GOAL #1  ? Title Improve postue and alignment with patient to demonstrate improved upright posture with posterior shoulder  girdle engage   ? Time 6   ? Period Weeks   ? Status New   ? Target Date 03/19/22   ?  ? PT LONG TERM GOAL #2  ? Title Decrease frequency and duration of Rt UE numbness by 50-75%   ? Time 6   ? Period Weeks   ? Status New   ? Target Date 03/19/22   ?  ? PT LONG TERM GOAL #3  ? Title Increase cervical ROM/mobility, to ~ equal bilat with no tightness reported   ? Time 6   ? Period Weeks   ? Status New   ? Target Date 03/19/22   ?  ? PT LONG TERM GOAL #4  ? Title Independent in [place]   ? Time 6   ? Period Weeks   ? Status New   ? Target Date 03/19/22   ?  ? PT LONG TERM GOAL #5  ? Title Improve functional limitation score to 66   ? Time 6   ? Period Weeks   ? Status New   ? Target Date 03/19/22   ? ?  ?  ? ?  ? ? ? ? ? ? ? ? Plan - 02/09/22 0951   ? ? Clinical Impression Statement Patient reports finding time to do her exercises twice since she was here for evaluation. Reviewed and corrected all exercises. Added thoracic extension with coregeous ball in the clinic. No new exercises at home. Encouraged more consistent HEP.  Trial of DN   ? Rehab Potential Good   ? PT Frequency 2x / week   ? PT Duration 6 weeks   ? PT Treatment/Interventions ADLs/Self Care Home Management;Aquatic Therapy;Cryotherapy;Electrical Stimulation;Iontophoresis '4mg'$ /ml Dexamethasone;Moist Heat;Ultrasound;Therapeutic activities;Therapeutic exercise;Neuromuscular re-education;Patient/family education;Manual techniques;Dry needling;Taping   ? PT Next Visit Plan review and progess HEP; instruct in myofacial ball release; trial of DN for Rt cervical and trap areas; modalities as indicated. Patient has a TENs unit at home.   ? PT Home Exercise Plan [email or web address]   ? Consulted and Agree with Plan of Care Patient   ? ?  ?  ? ?  ? ? ?Patient will benefit from skilled therapeutic intervention in order to improve the following deficits and impairments:    ? ?Visit Diagnosis: ?Radiculopathy, cervical region ? ?Other symptoms and signs involving the musculoskeletal system ? ?Abnormal posture ? ? ? ? ?Problem List ?Patient Active Problem List  ? Diagnosis Date Noted  ? Fracture of ankle, lateral malleolus, right, closed 12/24/2021  ? Radiculitis of right cervical region 12/23/2021  ? Chronic pain of right ankle 12/23/2021  ? Pain of left heel 04/13/2018  ? 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 Obesity 02/26/2016  ? ? ?Everardo All, PT, MPH ?02/09/2022, 11:50 AM ? ?[place] ?[facility] ?[place] ?[place], [place], [address] ?Phone: [phone]   Fax:  [phone] ? ?Name: Leslie Wiggins ?MRN: [id number] ?Date of Birth: 1971-03-27 ? ? ? ?

## 2022-02-16 ENCOUNTER — Encounter: Payer: Self-pay | Admitting: Rehabilitative and Restorative Service Providers"

## 2022-02-16 ENCOUNTER — Ambulatory Visit: Payer: 59 | Admitting: Rehabilitative and Restorative Service Providers"

## 2022-02-16 ENCOUNTER — Other Ambulatory Visit: Payer: Self-pay

## 2022-02-16 DIAGNOSIS — R29898 Other symptoms and signs involving the musculoskeletal system: Secondary | ICD-10-CM | POA: Diagnosis not present

## 2022-02-16 DIAGNOSIS — M5412 Radiculopathy, cervical region: Secondary | ICD-10-CM

## 2022-02-16 DIAGNOSIS — R293 Abnormal posture: Secondary | ICD-10-CM | POA: Diagnosis not present

## 2022-02-16 NOTE — Patient Instructions (Signed)
Access Code: [email or web address] ?URL: [email or web address] ?Date: 02/16/2022 ?Prepared by: Gillermo Murdoch ? ?Exercises ?Seated Cervical Retraction - 2 x daily - 7 x weekly - 1-2 sets - 5-10 reps - 10 sec hold ?Seated Scapular Retraction - 2 x daily - 7 x weekly - 1-2 sets - 10 reps - 10 sec hold ?Shoulder External Rotation and Scapular Retraction - 3 x daily - 7 x weekly - 1 sets - 10 reps - 3-5 sec hold ?Shoulder External Rotation in 45 Degrees Abduction - 2 x daily - 7 x weekly - 1-2 sets - 10 reps - 30 sec hold ?Doorway Pec Stretch at 60 Degrees Abduction - 3 x daily - 7 x weekly - 1 sets - 3 reps ?Doorway Pec Stretch at 90 Degrees Abduction - 3 x daily - 7 x weekly - 1 sets - 3 reps - 30 seconds hold ?Doorway Pec Stretch at 120 Degrees Abduction - 3 x daily - 7 x weekly - 1 sets - 3 reps - 30 second hold hold ?Shoulder External Rotation and Scapular Retraction with Resistance - 2 x daily - 7 x weekly - 3 sets - 10 reps ?Scapular Retraction with Resistance - 2 x daily - 7 x weekly - 3 sets - 10 reps ?Scapular Retraction with Resistance Advanced - 2 x daily - 7 x weekly - 3 sets - 10 reps ?Drawing Bow - 1 x daily - 7 x weekly - 1 sets - 10 reps - 3 sec hold ? ?

## 2022-02-16 NOTE — Therapy (Signed)
[place] ?[facility] ?[address] ?[place], [place], [address] ?Phone: [phone]   Fax:  [phone] ? ?Physical Therapy Treatment ? ?Patient Details  ?Name: Riham Polyakov ?MRN: [id number] ?Date of Birth: June 16, 1971 ?Referring Provider (PT): Dr Dianah Field ? ? ?Encounter Date: 02/16/2022 ? ? PT End of Session - 02/16/22 0849   ? ? Visit Number 3   ? Number of Visits 12   ? Date for PT Re-Evaluation 03/19/22   ? PT Start Time [phone]   ? PT Stop Time 0930   ? PT Time Calculation (min) 40 min   ? Activity Tolerance Patient tolerated treatment well   ? ?  ?  ? ?  ? ? ?Past Medical History:  ?Diagnosis Date  ? Anxiety   ? Depression   ? Hay fever   ? Hypertension   ? ? ?Past Surgical History:  ?Procedure Laterality Date  ? [place] EXTRACTION  1988  ? WISDOM TOOTH EXTRACTION    ? ? ?There were no vitals filed for this visit. ? ? Subjective Assessment - 02/16/22 0851   ? ? Subjective Patient reports that the DN freaked her out a little bit after and did have a HA but feels the treatment helped and would like to try again. She awoke with numbness in her hands this morning but this is the first time that has happened since beginnning therapy. She tinks that is progress.   ? Currently in Pain? No/denies   ? Pain Score 0-No pain   ? Pain Location Neck   ? Pain Orientation Right   ? ?  ?  ? ?  ? ? ? ? ? [facility] PT Assessment - 02/16/22 0001   ? ?  ? Assessment  ? Medical Diagnosis Rt cervical radiculitis   ? Referring Provider (PT) Dr Dianah Field   ? Onset Date/Surgical Date 02/03/21   ? Hand Dominance Right   ? Next MD Visit none scheduled   ? Prior Therapy chiropractic at 51 yr old   ?  ? Palpation  ? Spinal mobility hypomobile upper thoracic and mid to lower cervical spine with PA and lateral mobs   ? Palpation comment muscular tightness Rt > Lt pecs; ant/lat/post cervical musculature; upper trap; leveator   ? ?  ?  ? ?  ? ? ? ? ? ? ? ? ? ? ? ? ? ? ? ? [place] Adult PT  Treatment/Exercise - 02/16/22 0001   ? ?  ? Shoulder Exercises: Standing  ? Extension Strengthening;Both;10 reps;Theraband   ? Theraband Level (Shoulder Extension) Level 4 (Blue)   ? Row Strengthening;Both;10 reps;Theraband   ? Theraband Level (Shoulder Row) Level 4 (Blue)   ? Row Limitations bow and arrow x 10 reps each side blue TB   ? Retraction Strengthening;Both;10 reps;Theraband   ? Theraband Level (Shoulder Retraction) Level 2 (Red)   ? Other Standing Exercises chin tuck 5 sec x 5 reps; scap squeeze 5 sec x 5 reps; L's x 10; W's x 5 reps   ?  ? Shoulder Exercises: Stretch  ? Other Shoulder Stretches doorway stretch 3 postions 30sec hold x 2 reps each position   ?  ? Manual Therapy  ? Manual therapy comments skilled palpatioin to assess response ro DN and manual work   ? Joint Mobilization cervical and thoracic PA and lateral mobs Grade II/III   ? Soft tissue mobilization deep tissue work to tolerance cervical and upper thoracic musculature   ? Myofascial  Release bilat upper traps   ? ?  ?  ? ?  ? ? ? Trigger Point Dry Needling - 02/16/22 0001   ? ? Consent Given? Yes   ? Education Handout Provided Previously provided   ? Other Dry Needling bilat   ? Upper Trapezius Response Palpable increased muscle length;Twitch reponse elicited   ? Suboccipitals Response Palpable increased muscle length;Twitch response elicited   ? Scalenes Response Palpable increased muscle length;Twitch reponse elicited   ? Cervical multifidi Response Palpable increased muscle length;Twitch reponse elicited   ? Thoracic multifidi response Palpable increased muscle length;Twitch response elicited   ? ?  ?  ? ?  ? ? ? ? ? ? ? ? PT Education - 02/16/22 0904   ? ? Education Details HEP   ? Person(s) Educated Patient   ? Methods Explanation;Demonstration;Tactile cues;Verbal cues;Handout   ? Comprehension Verbalized understanding;Returned demonstration;Verbal cues required;Tactile cues required   ? ?  ?  ? ?  ? ? ? ? ? ? PT Long Term Goals -  02/05/22 1116   ? ?  ? PT LONG TERM GOAL #1  ? Title Improve postue and alignment with patient to demonstrate improved upright posture with posterior shoulder girdle engage   ? Time 6   ? Period Weeks   ? Status New   ? Target Date 03/19/22   ?  ? PT LONG TERM GOAL #2  ? Title Decrease frequency and duration of Rt UE numbness by 50-75%   ? Time 6   ? Period Weeks   ? Status New   ? Target Date 03/19/22   ?  ? PT LONG TERM GOAL #3  ? Title Increase cervical ROM/mobility, to ~ equal bilat with no tightness reported   ? Time 6   ? Period Weeks   ? Status New   ? Target Date 03/19/22   ?  ? PT LONG TERM GOAL #4  ? Title Independent in [place]   ? Time 6   ? Period Weeks   ? Status New   ? Target Date 03/19/22   ?  ? PT LONG TERM GOAL #5  ? Title Improve functional limitation score to 66   ? Time 6   ? Period Weeks   ? Status New   ? Target Date 03/19/22   ? ?  ?  ? ?  ? ? ? ? ? ? ? ? Plan - 02/16/22 0855   ? ? Clinical Impression Statement Overall positive response to DN and treatment. Patient reports less frequent numbness in hands. Patient has continued muscular tightness through the Lt/Rt upper traps, leveator, cervical musculature. Good response to DN and manual work with patient tolerating more dry needling today. Added posterior shouldr girdle strengthening without difficulty   ? Rehab Potential Good   ? PT Frequency 2x / week   ? PT Duration 6 weeks   ? PT Treatment/Interventions ADLs/Self Care Home Management;Aquatic Therapy;Cryotherapy;Electrical Stimulation;Iontophoresis '4mg'$ /ml Dexamethasone;Moist Heat;Ultrasound;Therapeutic activities;Therapeutic exercise;Neuromuscular re-education;Patient/family education;Manual techniques;Dry needling;Taping   ? PT Next Visit Plan review and progess HEP; instruct in myofacial ball release; trial of DN for Rt cervical and trap areas; modalities as indicated. Patient has a TENs unit at home.   ? PT Home Exercise Plan [email or web address]   ? Consulted and Agree with Plan of Care Patient    ? ?  ?  ? ?  ? ? ?Patient will benefit from skilled therapeutic intervention in order to improve the following deficits and impairments:    ? ?  Visit Diagnosis: ?Radiculopathy, cervical region ? ?Other symptoms and signs involving the musculoskeletal system ? ?Abnormal posture ? ? ? ? ?Problem List ?Patient Active Problem List  ? Diagnosis Date Noted  ? Fracture of ankle, lateral malleolus, right, closed 12/24/2021  ? Radiculitis of right cervical region 12/23/2021  ? Chronic pain of right ankle 12/23/2021  ? Pain of left heel 04/13/2018  ? 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 Obesity 02/26/2016  ? ? ?Everardo All, PT, MPH ?02/16/2022, 9:29 AM ? ?[place] ?[facility] ?[address] ?[place], [place], [address] ?Phone: [phone]   Fax:  [phone] ? ?Name: Sonji Starkes ?MRN: [id number] ?Date of Birth: 1970-12-11 ? ? ? ?

## 2022-02-23 ENCOUNTER — Other Ambulatory Visit: Payer: Self-pay

## 2022-02-23 ENCOUNTER — Ambulatory Visit: Payer: 59 | Admitting: Physical Therapy

## 2022-02-23 DIAGNOSIS — R29898 Other symptoms and signs involving the musculoskeletal system: Secondary | ICD-10-CM

## 2022-02-23 DIAGNOSIS — M5412 Radiculopathy, cervical region: Secondary | ICD-10-CM | POA: Diagnosis not present

## 2022-02-23 DIAGNOSIS — R293 Abnormal posture: Secondary | ICD-10-CM | POA: Diagnosis not present

## 2022-02-23 NOTE — Therapy (Addendum)
[place] ?[facility] ?[address] ?[place], [place], [address] ?Phone: [phone]   Fax:  [phone] ? ?Physical Therapy Treatment and Discharge Summary ? ?PHYSICAL THERAPY DISCHARGE SUMMARY ? ?Visits from Start of Care: 4 ? ?Current functional level related to goals / functional outcomes: ?See progress note for discharge status ?  ?Remaining deficits: ?Unknown  ?  ?Education / Equipment: ?HEP   ? ?Patient agrees to discharge. Patient goals were not met. Patient is being discharged due to not returning since the last visit. ?Celyn P. Helene Kelp PT, MPH ?04/05/22 3:03 PM ? ? ?Patient Details  ?Name: Leslie Wiggins ?MRN: [id number] ?Date of Birth: [birth year] ?Referring Provider (PT): Dr Dianah Field ? ? ?Encounter Date: 02/23/2022 ? ? PT End of Session - 02/23/22 0927   ? ? Visit Number 4   ? Number of Visits 12   ? Date for PT Re-Evaluation 03/19/22   ? PT Start Time [phone]   ? PT Stop Time [phone]   ? PT Time Calculation (min) 38 min   ? Activity Tolerance Patient tolerated treatment well   ? Behavior During Therapy [facility] for tasks assessed/performed   ? ?  ?  ? ?  ? ? ?Past Medical History:  ?Diagnosis Date  ? Anxiety   ? Depression   ? Hay fever   ? Hypertension   ? ? ?Past Surgical History:  ?Procedure Laterality Date  ? [place] EXTRACTION  1988  ? WISDOM TOOTH EXTRACTION    ? ? ?There were no vitals filed for this visit. ? ? Subjective Assessment - 02/23/22 0854   ? ? Subjective Pt states she is still very stressed and is not able to exercise at home. She states she continues to feel tight but feels it is all stress related   ? Patient Stated Goals stop having numbness in fingers   ? Currently in Pain? Yes   ? Pain Score 2    ? Pain Location Neck   ? Pain Descriptors / Indicators Tightness   ? ?  ?  ? ?  ? ? ? ? ? [facility] PT Assessment - 02/23/22 0001   ? ?  ? Assessment  ? Medical Diagnosis Rt cervical radiculitis   ? Referring Provider (PT) Dr Dianah Field   ? Onset  Date/Surgical Date 02/03/21   ? Hand Dominance Right   ? Next MD Visit none scheduled   ? Prior Therapy chiropractic at 51 yr old   ? ?  ?  ? ?  ? ? ? ? ? ? ? ? ? ? ? ? ? ? ? ? [place] Adult PT Treatment/Exercise - 02/23/22 0001   ? ?  ? Shoulder Exercises: Standing  ? Extension 20 reps   ? Theraband Level (Shoulder Extension) Level 4 (Blue)   ? Row 20 reps   ? Theraband Level (Shoulder Row) Level 4 (Blue)   ? Row Limitations bow and arrow x 10 bilat   ? Retraction 20 reps   ? Theraband Level (Shoulder Retraction) Level 2 (Red)   ? Other Standing Exercises chin tuck 5 reps x 5 sec, scap squeeze 5 x 5 sec   ?  ? Shoulder Exercises: Stretch  ? Other Shoulder Stretches doorway stretch 3 postions 30sec hold x 2 reps each position   ?  ? Manual Therapy  ? Joint Mobilization cervical and thoracic PA and lateral mobs Grade II/III   ? Soft tissue mobilization suboccipital release, STM upper traps and cervical paraspinals   ? ?  ?  ? ?  ? ? ? ? ? ? ? ? ? ? ? ? ? ? ?  PT Long Term Goals - 02/05/22 1116   ? ?  ? PT LONG TERM GOAL #1  ? Title Improve postue and alignment with patient to demonstrate improved upright posture with posterior shoulder girdle engage   ? Time 6   ? Period Weeks   ? Status New   ? Target Date 03/19/22   ?  ? PT LONG TERM GOAL #2  ? Title Decrease frequency and duration of Rt UE numbness by 50-75%   ? Time 6   ? Period Weeks   ? Status New   ? Target Date 03/19/22   ?  ? PT LONG TERM GOAL #3  ? Title Increase cervical ROM/mobility, to ~ equal bilat with no tightness reported   ? Time 6   ? Period Weeks   ? Status New   ? Target Date 03/19/22   ?  ? PT LONG TERM GOAL #4  ? Title Independent in [place]   ? Time 6   ? Period Weeks   ? Status New   ? Target Date 03/19/22   ?  ? PT LONG TERM GOAL #5  ? Title Improve functional limitation score to 66   ? Time 6   ? Period Weeks   ? Status New   ? Target Date 03/19/22   ? ?  ?  ? ?  ? ? ? ? ? ? ? ? Plan - 02/23/22 0927   ? ? Clinical Impression Statement Pt reports  less pain after manual work. Requires cues for form with theraband exercises but no increase in symptoms.   ? PT Next Visit Plan progress HEP, manual and modalities as indicated   ? PT Home Exercise Plan [email or web address]   ? ?  ?  ? ?  ? ? ?Patient will benefit from skilled therapeutic intervention in order to improve the following deficits and impairments:    ? ?Visit Diagnosis: ?Radiculopathy, cervical region ? ?Other symptoms and signs involving the musculoskeletal system ? ?Abnormal posture ? ? ? ? ?Problem List ?Patient Active Problem List  ? Diagnosis Date Noted  ? Fracture of ankle, lateral malleolus, right, closed 12/24/2021  ? Radiculitis of right cervical region 12/23/2021  ? Chronic pain of right ankle 12/23/2021  ? Pain of left heel 04/13/2018  ? 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 Obesity 02/26/2016  ? ? ?Taleeyah Bora, PT ?02/23/2022, 9:29 AM ? ?[place] ?[facility] ?[address] ?[place], [place], [address] ?Phone: [phone]   Fax:  [phone] ? ?Name: Suraiya Dickerson ?MRN: [id number] ?Date of Birth: 02-Sep-1971 ? ? ? ?

## 2022-02-28 ENCOUNTER — Other Ambulatory Visit: Payer: Self-pay | Admitting: Medical-Surgical

## 2022-02-28 ENCOUNTER — Encounter: Payer: Self-pay | Admitting: Medical-Surgical

## 2022-02-28 DIAGNOSIS — F418 Other specified anxiety disorders: Secondary | ICD-10-CM

## 2022-03-02 ENCOUNTER — Other Ambulatory Visit (HOSPITAL_COMMUNITY): Payer: Self-pay

## 2022-03-02 ENCOUNTER — Ambulatory Visit: Payer: 59 | Admitting: Rehabilitative and Restorative Service Providers"

## 2022-03-02 MED ORDER — ALPRAZOLAM 0.25 MG PO TABS
ORAL_TABLET | ORAL | 0 refills | Status: DC
  Filled 2022-03-02: qty 30, 30d supply, fill #0

## 2022-03-02 NOTE — Telephone Encounter (Signed)
Last appt 12/02/2021  ? ?Last written 12/23/2021 #30 with no refills ?

## 2022-03-03 DIAGNOSIS — H04123 Dry eye syndrome of bilateral lacrimal glands: Secondary | ICD-10-CM | POA: Diagnosis not present

## 2022-03-03 DIAGNOSIS — H5203 Hypermetropia, bilateral: Secondary | ICD-10-CM | POA: Diagnosis not present

## 2022-04-05 ENCOUNTER — Other Ambulatory Visit: Payer: Self-pay | Admitting: Medical-Surgical

## 2022-04-05 ENCOUNTER — Encounter: Payer: Self-pay | Admitting: Medical-Surgical

## 2022-04-05 DIAGNOSIS — Z0182 Encounter for allergy testing: Secondary | ICD-10-CM

## 2022-04-08 ENCOUNTER — Other Ambulatory Visit (HOSPITAL_COMMUNITY): Payer: Self-pay

## 2022-04-08 MED ORDER — LOSARTAN POTASSIUM 100 MG PO TABS
100.0000 mg | ORAL_TABLET | Freq: Every day | ORAL | 3 refills | Status: DC
  Filled 2022-04-08: qty 90, 90d supply, fill #0

## 2022-04-15 ENCOUNTER — Ambulatory Visit (INDEPENDENT_AMBULATORY_CARE_PROVIDER_SITE_OTHER): Payer: 59 | Admitting: Licensed Clinical Social Worker

## 2022-04-15 ENCOUNTER — Encounter (HOSPITAL_COMMUNITY): Payer: Self-pay

## 2022-04-15 DIAGNOSIS — F331 Major depressive disorder, recurrent, moderate: Secondary | ICD-10-CM | POA: Diagnosis not present

## 2022-04-15 NOTE — Plan of Care (Signed)
?  Problem: Depression CCP Problem  1 Coping  ?Goal:  Bethena Roys will manage mood, anxiety, and grief as evidenced by coping with depressive and anxious thoughts, processing mourning and grief as mother's health declines, challenge negative thoughts, and improve sleep for 5 out of 7 days for 60 days.  ?Outcome: Not Progressing ?Goal: STG: Gladis Riffle" WILL IDENTIFY 5 COGNITIVE PATTERNS AND BELIEFS THAT SUPPORT DEPRESSION ?Outcome: Not Progressing ?  ?
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
## 2022-04-30 ENCOUNTER — Encounter: Payer: Self-pay | Admitting: Neurology

## 2022-05-06 ENCOUNTER — Other Ambulatory Visit: Payer: Self-pay | Admitting: Medical-Surgical

## 2022-05-06 DIAGNOSIS — F418 Other specified anxiety disorders: Secondary | ICD-10-CM

## 2022-05-10 ENCOUNTER — Other Ambulatory Visit (HOSPITAL_COMMUNITY): Payer: Self-pay

## 2022-05-10 ENCOUNTER — Encounter: Payer: Self-pay | Admitting: Medical-Surgical

## 2022-05-10 DIAGNOSIS — F418 Other specified anxiety disorders: Secondary | ICD-10-CM

## 2022-05-10 MED ORDER — ALPRAZOLAM 0.25 MG PO TABS
ORAL_TABLET | ORAL | 0 refills | Status: DC
  Filled 2022-05-10: qty 30, 30d supply, fill #0

## 2022-05-14 ENCOUNTER — Ambulatory Visit (INDEPENDENT_AMBULATORY_CARE_PROVIDER_SITE_OTHER): Payer: 59 | Admitting: Licensed Clinical Social Worker

## 2022-05-14 DIAGNOSIS — F331 Major depressive disorder, recurrent, moderate: Secondary | ICD-10-CM

## 2022-05-15 NOTE — Progress Notes (Signed)
   THERAPIST PROGRESS NOTE  Session Time: 11:00 am-11:45 am  Type of Therapy: Individual Therapy  Purpose of session: Leslie Wiggins will manage mood, anxiety, and grief as evidenced by coping with depressive and anxious thoughts, processing mourning and grief as mother's health declines, challenge negative thoughts, and improve sleep for 5 out of 7 days for 60 days.   ProgressTowards Goals: Initial  Session #1  Interventions: Therapist utilized CBT and Solution Focused brief therapy to address mood. Therapist provided support and empathy to patient during session. Therapist processed patient's feelings to identify triggers. Therapist allowed space to share her feelings of grief related to her mother.   Effectiveness: Patient was oriented x4 (person, place, situation, and time). Patient was casually dressed, and appropriately groomed. Patient is alert, engaged, pleasant and tearful, and cooperative. Patient was overwhelmed. Her mother's health continues to decline. Patient came home from work and the pets (cats, dogs) had defecated on the floor. She stepped in it and tracked it through the house, etc. Her brother had left her mother on the bed pan but not fully on so her mother defecated in the bed. Patient was overwhelmed. She had to clean it up and finally got to lay down. She got a full nights sleep. Patient is already grieving her mother. She feels helpless to help her mother. She sees her mother's health decline and knows she will pass soon. She feels like she is killing her mother with the medication to keep her mother comfortable. Patient understood that it is the disease and not her killing her mother.   Patient engaged in session. Patient responded well to interventions. Patient continues to meet criteria for Major Depressive disorder, recurrent episode, moderate with anxious distress. Patient will continue in outpatient therapy due to being the least restrictive service to meet her needs. Patient  made minimal progress.   Suicidal/Homicidal: Nowithout intent/plan  Plan: Return again in 2-4 weeks.  Diagnosis: Major depressive disorder, recurrent episode, moderate with anxious distress ([place])  Collaboration of Care: Other sources will be identified.   Patient/Guardian was advised Release of Information must be obtained prior to any record release in order to collaborate their care with an outside provider. Patient/Guardian was advised if they have not already done so to contact the registration department to sign all necessary forms in order for [place] to release information regarding their care.   Consent: Patient/Guardian gives verbal consent for treatment and assignment of benefits for services provided during this visit. Patient/Guardian expressed understanding and agreed to proceed.   Glori Bickers, LCSW 05/15/2022

## 2022-05-18 ENCOUNTER — Other Ambulatory Visit (HOSPITAL_COMMUNITY): Payer: Self-pay

## 2022-05-18 ENCOUNTER — Ambulatory Visit (INDEPENDENT_AMBULATORY_CARE_PROVIDER_SITE_OTHER): Payer: 59 | Admitting: Psychiatry

## 2022-05-18 ENCOUNTER — Encounter (HOSPITAL_COMMUNITY): Payer: Self-pay | Admitting: Psychiatry

## 2022-05-18 VITALS — BP 120/82 | Temp 98.6°F | Ht 66.0 in | Wt 238.0 lb

## 2022-05-18 DIAGNOSIS — F331 Major depressive disorder, recurrent, moderate: Secondary | ICD-10-CM

## 2022-05-18 DIAGNOSIS — F5102 Adjustment insomnia: Secondary | ICD-10-CM | POA: Diagnosis not present

## 2022-05-18 DIAGNOSIS — F411 Generalized anxiety disorder: Secondary | ICD-10-CM | POA: Diagnosis not present

## 2022-05-18 MED ORDER — FLUOXETINE HCL 10 MG PO CAPS
10.0000 mg | ORAL_CAPSULE | Freq: Every day | ORAL | 2 refills | Status: DC
  Filled 2022-05-18: qty 30, 30d supply, fill #0

## 2022-05-18 MED ORDER — TEMAZEPAM 15 MG PO CAPS
15.0000 mg | ORAL_CAPSULE | Freq: Every evening | ORAL | 0 refills | Status: DC | PRN
  Filled 2022-05-18: qty 30, 30d supply, fill #0
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
## 2022-05-21 ENCOUNTER — Ambulatory Visit (INDEPENDENT_AMBULATORY_CARE_PROVIDER_SITE_OTHER): Payer: 59 | Admitting: Licensed Clinical Social Worker

## 2022-05-21 DIAGNOSIS — F331 Major depressive disorder, recurrent, moderate: Secondary | ICD-10-CM | POA: Diagnosis not present

## 2022-05-21 DIAGNOSIS — F411 Generalized anxiety disorder: Secondary | ICD-10-CM

## 2022-05-22 NOTE — Progress Notes (Signed)
   THERAPIST PROGRESS NOTE  Session Time: 11:00 am-11:45 am  Type of Therapy: Individual Therapy  Purpose of session: Bethena Roys will manage mood, anxiety, and grief as evidenced by coping with depressive and anxious thoughts, processing mourning and grief as mother's health declines, challenge negative thoughts, and improve sleep for 5 out of 7 days for 60 days.   ProgressTowards Goals: Initial  Session #2  Interventions: Therapist utilized CBT and Solution Focused brief therapy to address mood. Therapist provided support and empathy to patient during session. Therapist processed patient's feelings that lead to a change in perspective for her. Therapist explored patient's interpersonal relationships.   Effectiveness: Patient was oriented x4 (person, place, situation, and time). Patient was casually dressed, and appropriately groomed. Patient was alert, engaged, pleasant, and cooperative. Patient said that her "aunties" are in town and helping with her mother. They are helpful and a calming presence. Patient spoke with her mother and has decided to start the morphine to help keep her comfortable, and help her transition. Patient has shifted her thinking from "I'll be killing my mother" to her mother has no quality of life and is in constantly pain. Her mother has written thank you notes to her friends and her family which are essentially good by letters. Patient has also been talking with a female friend in another state. He had promised to come to help patient with her mother but never came. Patient was going to care for him as he had multiple major surgeries. Patient is hesitant to do this now. She feels like he will try to connect/trauma bond after her mother passes. She is being mindful of this and his behaviors to avoid getting committed to a relationship while she is down.  Patient engaged in session. Patient responded well to interventions. Patient continues to meet criteria for Major Depressive  disorder, recurrent episode, moderate with anxious distress. Patient will continue in outpatient therapy due to being the least restrictive service to meet her needs. Patient made minimal progress.   Suicidal/Homicidal: Nowithout intent/plan  Plan: Return again in 2-4 weeks.  Diagnosis: Major depressive disorder, recurrent episode, moderate with anxious distress ([facility])  GAD (generalized anxiety disorder)  Collaboration of Care: Other sources will be identified.   Patient/Guardian was advised Release of Information must be obtained prior to any record release in order to collaborate their care with an outside provider. Patient/Guardian was advised if they have not already done so to contact the registration department to sign all necessary forms in order for [place] to release information regarding their care.   Consent: Patient/Guardian gives verbal consent for treatment and assignment of benefits for services provided during this visit. Patient/Guardian expressed understanding and agreed to proceed.   Glori Bickers, LCSW 05/22/2022

## 2022-05-28 ENCOUNTER — Ambulatory Visit (INDEPENDENT_AMBULATORY_CARE_PROVIDER_SITE_OTHER): Payer: 59 | Admitting: Licensed Clinical Social Worker

## 2022-05-28 DIAGNOSIS — F331 Major depressive disorder, recurrent, moderate: Secondary | ICD-10-CM | POA: Diagnosis not present

## 2022-06-03 ENCOUNTER — Ambulatory Visit (INDEPENDENT_AMBULATORY_CARE_PROVIDER_SITE_OTHER): Payer: 59 | Admitting: Licensed Clinical Social Worker

## 2022-06-03 DIAGNOSIS — F331 Major depressive disorder, recurrent, moderate: Secondary | ICD-10-CM | POA: Diagnosis not present

## 2022-06-03 DIAGNOSIS — F411 Generalized anxiety disorder: Secondary | ICD-10-CM

## 2022-06-04 NOTE — Progress Notes (Signed)
   THERAPIST PROGRESS NOTE  Session Time: 4:00 pm-4:45 pm  Type of Therapy: Individual Therapy  Purpose of session: Bethena Roys will manage mood, anxiety, and grief as evidenced by coping with depressive and anxious thoughts, processing mourning and grief as mother's health declines, challenge negative thoughts, and improve sleep for 5 out of 7 days for 60 days.   ProgressTowards Goals: Progressing  Session #3  Interventions: Therapist utilized CBT and Solution Focused brief therapy to address mood. Therapist provided support and empathy to patient during session. Therapist explored patient's feelings about mother's end of life care. Therapist explored patient's interpersonal relationships.   Effectiveness: Patient was oriented x4 (person, place, situation, and time). Patient was alert, engaged, pleasant, and cooperative. Patient was dressed for work, and appropriately groomed. Patient was tearful at times during session. Her sister is coming into town. She will be starting her mother on morphine when her sister arrives. She noted her mother stated she is not ready to start the morphine but she also didn't know who the president was or other common questions. Her mother's quality of life has greatly declined. Patient made a promise to her mother to be with her until the end. She feels like she has kept that promise and more. Patient has not heard much from her female friend. He has not kept his promise to come and see her. She noted he has answered the phone with "What" or "yeah" when she calls and it hurts her feelings. She is not sure about how truthful he is. He served in [employer] but has shared some outrageous stories about his experience and his life now that sound like conspiracy theories.    Patient engaged in session. Patient responded well to interventions. Patient continues to meet criteria for Major Depressive disorder, recurrent episode, moderate with anxious distress. Patient will continue  in outpatient therapy due to being the least restrictive service to meet her needs. Patient made minimal progress.   Suicidal/Homicidal: Nowithout intent/plan  Plan: Return again in 2-4 weeks. Patient will utilize supports as she starts her mother on morphine for end of life care and remind herself about her mother's quality of life.   Diagnosis: Major depressive disorder, recurrent episode, moderate with anxious distress ([facility])  GAD (generalized anxiety disorder)  Collaboration of Care: Other sources will be identified.   Patient/Guardian was advised Release of Information must be obtained prior to any record release in order to collaborate their care with an outside provider. Patient/Guardian was advised if they have not already done so to contact the registration department to sign all necessary forms in order for [place] to release information regarding their care.   Consent: Patient/Guardian gives verbal consent for treatment and assignment of benefits for services provided during this visit. Patient/Guardian expressed understanding and agreed to proceed.   Glori Bickers, LCSW 06/04/2022

## 2022-06-17 ENCOUNTER — Ambulatory Visit (HOSPITAL_COMMUNITY): Payer: 59 | Admitting: Psychiatry

## 2022-06-25 ENCOUNTER — Ambulatory Visit (HOSPITAL_COMMUNITY): Payer: 59 | Admitting: Licensed Clinical Social Worker

## 2022-07-09 ENCOUNTER — Ambulatory Visit (HOSPITAL_COMMUNITY): Payer: 59 | Admitting: Licensed Clinical Social Worker

## 2022-07-14 ENCOUNTER — Other Ambulatory Visit (HOSPITAL_COMMUNITY): Payer: Self-pay

## 2022-07-16 ENCOUNTER — Ambulatory Visit (HOSPITAL_COMMUNITY): Payer: 59 | Admitting: Licensed Clinical Social Worker

## 2022-07-23 ENCOUNTER — Ambulatory Visit (HOSPITAL_COMMUNITY): Payer: 59 | Admitting: Licensed Clinical Social Worker

## 2022-08-10 DIAGNOSIS — L403 Pustulosis palmaris et plantaris: Secondary | ICD-10-CM | POA: Diagnosis not present

## 2022-08-10 DIAGNOSIS — Z5181 Encounter for therapeutic drug level monitoring: Secondary | ICD-10-CM | POA: Diagnosis not present

## 2022-08-12 ENCOUNTER — Ambulatory Visit: Payer: 59 | Admitting: Medical-Surgical

## 2022-08-12 ENCOUNTER — Other Ambulatory Visit (HOSPITAL_COMMUNITY): Payer: Self-pay

## 2022-08-12 ENCOUNTER — Encounter: Payer: Self-pay | Admitting: Medical-Surgical

## 2022-08-12 VITALS — BP 116/75 | HR 78 | Resp 20 | Ht 66.0 in | Wt 240.2 lb

## 2022-08-12 DIAGNOSIS — I10 Essential (primary) hypertension: Secondary | ICD-10-CM | POA: Diagnosis not present

## 2022-08-12 DIAGNOSIS — F418 Other specified anxiety disorders: Secondary | ICD-10-CM | POA: Diagnosis not present

## 2022-08-12 DIAGNOSIS — M62838 Other muscle spasm: Secondary | ICD-10-CM

## 2022-08-12 DIAGNOSIS — G4726 Circadian rhythm sleep disorder, shift work type: Secondary | ICD-10-CM

## 2022-08-12 DIAGNOSIS — Z79899 Other long term (current) drug therapy: Secondary | ICD-10-CM | POA: Diagnosis not present

## 2022-08-12 MED ORDER — METHOCARBAMOL 500 MG PO TABS
500.0000 mg | ORAL_TABLET | Freq: Every day | ORAL | 1 refills | Status: DC | PRN
  Filled 2022-08-12: qty 30, 30d supply, fill #0
  Filled 2022-10-22: qty 30, 30d supply, fill #1

## 2022-08-12 MED ORDER — ALPRAZOLAM 0.25 MG PO TABS
ORAL_TABLET | ORAL | 0 refills | Status: DC
  Filled 2022-08-12: qty 30, 30d supply, fill #0

## 2022-08-12 MED ORDER — LOSARTAN POTASSIUM 100 MG PO TABS
100.0000 mg | ORAL_TABLET | Freq: Every day | ORAL | 3 refills | Status: DC
  Filled 2022-08-12: qty 90, 90d supply, fill #0

## 2022-08-12 MED ORDER — TRAZODONE HCL 50 MG PO TABS
50.0000 mg | ORAL_TABLET | Freq: Every day | ORAL | 1 refills | Status: DC
  Filled 2022-08-12: qty 90, 90d supply, fill #0
  Filled 2023-03-22: qty 90, 90d supply, fill #1

## 2022-08-12 NOTE — Progress Notes (Signed)
Established Patient Office Visit  Subjective   Patient ID: Leslie Wiggins, female   DOB: Jun 17, 1971 Age: 51 y.o. MRN: [id number]   Chief Complaint  Patient presents with   Follow-up   HPI Pleasant 51 year old female presenting today for the following:  Mood: Had previously requested a referral to psychiatry where she was able to connect with Dr. De Nurse.  She saw him once and was started on fluoxetine 10 mg daily.  She took that for a couple of weeks and when she went to follow-up at that point, she notes that her mother passed away and she missed the appointment.  Since then she has canceled her appointments with him as she has decided she does not want to be on Prozac.  She feels that she is following the natural grieving process now and that her overall mood is a lot better.  She does still have crying jags, specifically when she thinks of her mother or talks about her.  She feels that this is a normal behavior.  She does still have some underlying anxiety and difficulty sleeping.  At one point she was on temazepam 30 mg nightly however this was reduced to 50 mg nightly as she was worried about oversedation while caring for her critically ill mother.  She took trazodone in the past and notes that the temazepam works better however the trazodone did not leave her quite as groggy.  She is not taking a controller medication for her mood management and is only using Xanax 1/2 tablet very sparingly.  She reports using this approximately 3 times weekly and does not like to depend on the medication if she can help it.  Reports that she does currently use an herbal supplement called relax and sleep as well as a sleep aid that contains doxylamine.  She takes half a tablet of each of these at bedtime which she reports is helpful most of the time.  When its not, she will take the half tab Xanax to help her sleep.  If that is not helpful, then she has returned to using temazepam but tries to avoid this if at  all possible.  She is currently seeing dermatology for psoriasis and has recently been started on a new biologic.  She is not sure what the name of the medication is but she will send [place] a message to update this in her chart.  She notes that her dermatologist wanted her to have some lab work but she would prefer to get it while she is here today and we can send the results over.  Notes that they want to do a CBC and a BMP to manage for high risk medications.  Does have significant low back pain that flares when she is overly active.  Notes that her job is very sedentary and therefore she finds herself getting muscle spasms when her activity level has to be increased.  She has tried meloxicam and has used that successfully with some resolution to her symptoms however when she stops the medication, the symptoms come back.  She would prefer not to use this regularly if at all possible.  Wonders if there is anything she can do to help with muscle spasms especially when she is trying to relax and get comfortable for sleep.  Hypertension: Taking losartan 100 mg daily, tolerating well without side effects. Denies CP, SOB, palpitations, lower extremity edema, dizziness, headaches, or vision changes.   Objective:    Vitals:   08/12/22 1516  BP: 116/75  Pulse: 78  Resp: 20  Height: '5\' 6"'$  (1.676 m)  Weight: 240 lb 3.2 oz (109 kg)  SpO2: 96%  BMI (Calculated): 38.79    Physical Exam Vitals and nursing note reviewed.  Constitutional:      General: She is not in acute distress.    Appearance: Normal appearance. She is obese. She is not ill-appearing.  HENT:     Head: Normocephalic and atraumatic.  Cardiovascular:     Rate and Rhythm: Normal rate and regular rhythm.     Pulses: Normal pulses.     Heart sounds: Normal heart sounds.  Pulmonary:     Effort: Pulmonary effort is normal. No respiratory distress.     Breath sounds: Normal breath sounds. No wheezing, rhonchi or rales.  Skin:    General:  Skin is warm and dry.  Neurological:     Mental Status: She is alert and oriented to person, place, and time.  Psychiatric:        Mood and Affect: Mood normal.        Behavior: Behavior normal.        Thought Content: Thought content normal.        Judgment: Judgment normal.    No results found for this or any previous visit (from the past 24 hour(s)).     The ASCVD Risk score (Arnett DK, et al., 2019) failed to calculate for the following reasons:   Cannot find a previous HDL lab   Assessment & Plan:   1. High risk medication use Checking labs as below.  Advised patient that we will try to send these over to dermatology however we may not be able to CC them in the system.  She is always welcome to print out her results and provide them to the practice if they are unable to see them in epic. - CBC with Differential/Platelet - COMPLETE METABOLIC PANEL WITH GFR  2. Shift work sleep disorder After much discussion, I do not like the idea that she is on 2 benzodiazepines.  She is aware that it is not recommended to use a benzodiazepine for sleep outside of temazepam.  Since she would prefer to have Xanax on hand for situational anxiety, we we did review options for medications to help with sleep.  Okay to use the over-the-counter herbal supplement and doxylamine if preferred.  Restarting trazodone 50 mg at bedtime as needed. - traZODone (DESYREL) 50 MG tablet; Take 1 tablet (50 mg total) by mouth at bedtime.  Dispense: 90 tablet; Refill: 1  3. Situational anxiety Discussed the recommendations for management of situational anxiety.  I would much prefer that she be on a controller medicine such as fluoxetine however she is adamant that she does not want to go this route.  She has continued to use the alprazolam very sparingly so we have agreed that I will send in a prescription of the 0.25 mg tablets with 30 tablets sent to last for 90 days. - ALPRAZolam (XANAX) 0.25 MG tablet; TAKE 1/2 OR 1  TABLET BY MOUTH ONCE DAILY AS NEEDED FOR ANXIETY  Dispense: 30 tablet; Refill: 0  4. Primary hypertension Blood pressure very well controlled today.  Continue losartan 100 mg daily.  Low-sodium diet, regular intentional exercise, and weight loss to healthy weight recommended.  5.  Muscle spasm She does have some significant back issues so we will go ahead and do a low-dose methocarbamol 500 mg as needed once daily to see if this is  beneficial.  Advised to avoid taking this with alprazolam to prevent oversedation.  Patient verbalized understanding and is agreeable to the plan.  Return in about 3 months (around 11/11/2022) for chronic disease follow up.  ___________________________________________ Clearnce Sorrel, DNP, APRN, FNP-BC [facility] [place]

## 2022-08-13 ENCOUNTER — Encounter: Payer: Self-pay | Admitting: Medical-Surgical

## 2022-08-13 LAB — CBC WITH DIFFERENTIAL/PLATELET
Absolute Monocytes: 578 cells/uL (ref 200–950)
Basophils Absolute: 55 cells/uL (ref 0–200)
Basophils Relative: 0.5 %
Eosinophils Absolute: 174 cells/uL (ref 15–500)
Eosinophils Relative: 1.6 %
HCT: 46.2 % — ABNORMAL HIGH (ref 35.0–45.0)
Hemoglobin: 15.3 g/dL (ref 11.7–15.5)
Lymphs Abs: 3684 cells/uL (ref 850–3900)
MCH: 30.3 pg (ref 27.0–33.0)
MCHC: 33.1 g/dL (ref 32.0–36.0)
MCV: 91.5 fL (ref 80.0–100.0)
MPV: 9.2 fL (ref 7.5–12.5)
Monocytes Relative: 5.3 %
Neutro Abs: 6409 cells/uL (ref 1500–7800)
Neutrophils Relative %: 58.8 %
Platelets: 362 10*3/uL (ref 140–400)
RBC: 5.05 10*6/uL (ref 3.80–5.10)
RDW: 12.5 % (ref 11.0–15.0)
Total Lymphocyte: 33.8 %
WBC: 10.9 10*3/uL — ABNORMAL HIGH (ref 3.8–10.8)

## 2022-08-13 LAB — COMPLETE METABOLIC PANEL WITH GFR
AG Ratio: 1.3 (calc) (ref 1.0–2.5)
ALT: 15 U/L (ref 6–29)
AST: 14 U/L (ref 10–35)
Albumin: 4 g/dL (ref 3.6–5.1)
Alkaline phosphatase (APISO): 113 U/L (ref 37–153)
BUN: 11 mg/dL (ref 7–25)
CO2: 30 mmol/L (ref 20–32)
Calcium: 9.4 mg/dL (ref 8.6–10.4)
Chloride: 106 mmol/L (ref 98–110)
Creat: 0.66 mg/dL (ref 0.50–1.03)
Globulin: 3 g/dL (calc) (ref 1.9–3.7)
Glucose, Bld: 84 mg/dL (ref 65–99)
Potassium: 4.1 mmol/L (ref 3.5–5.3)
Sodium: 141 mmol/L (ref 135–146)
Total Bilirubin: 0.8 mg/dL (ref 0.2–1.2)
Total Protein: 7 g/dL (ref 6.1–8.1)
eGFR: 106 mL/min/{1.73_m2} (ref >=60)

## 2022-08-16 NOTE — Telephone Encounter (Signed)
Please add to medication list

## 2022-08-17 ENCOUNTER — Encounter: Payer: Self-pay | Admitting: Medical-Surgical

## 2022-08-17 NOTE — Progress Notes (Signed)
Added medication per MyChart message.

## 2022-08-17 NOTE — Telephone Encounter (Signed)
Added medication 

## 2022-08-22 IMAGING — DX DG ANKLE COMPLETE 3+V*R*
3 series · 3 of 3 positions shown · non-contrast
Comparison: None.

CLINICAL DATA: Chronic neck pain and ankle swelling.

EXAM:
RIGHT ANKLE - COMPLETE 3+ VIEW

[ankle ap]
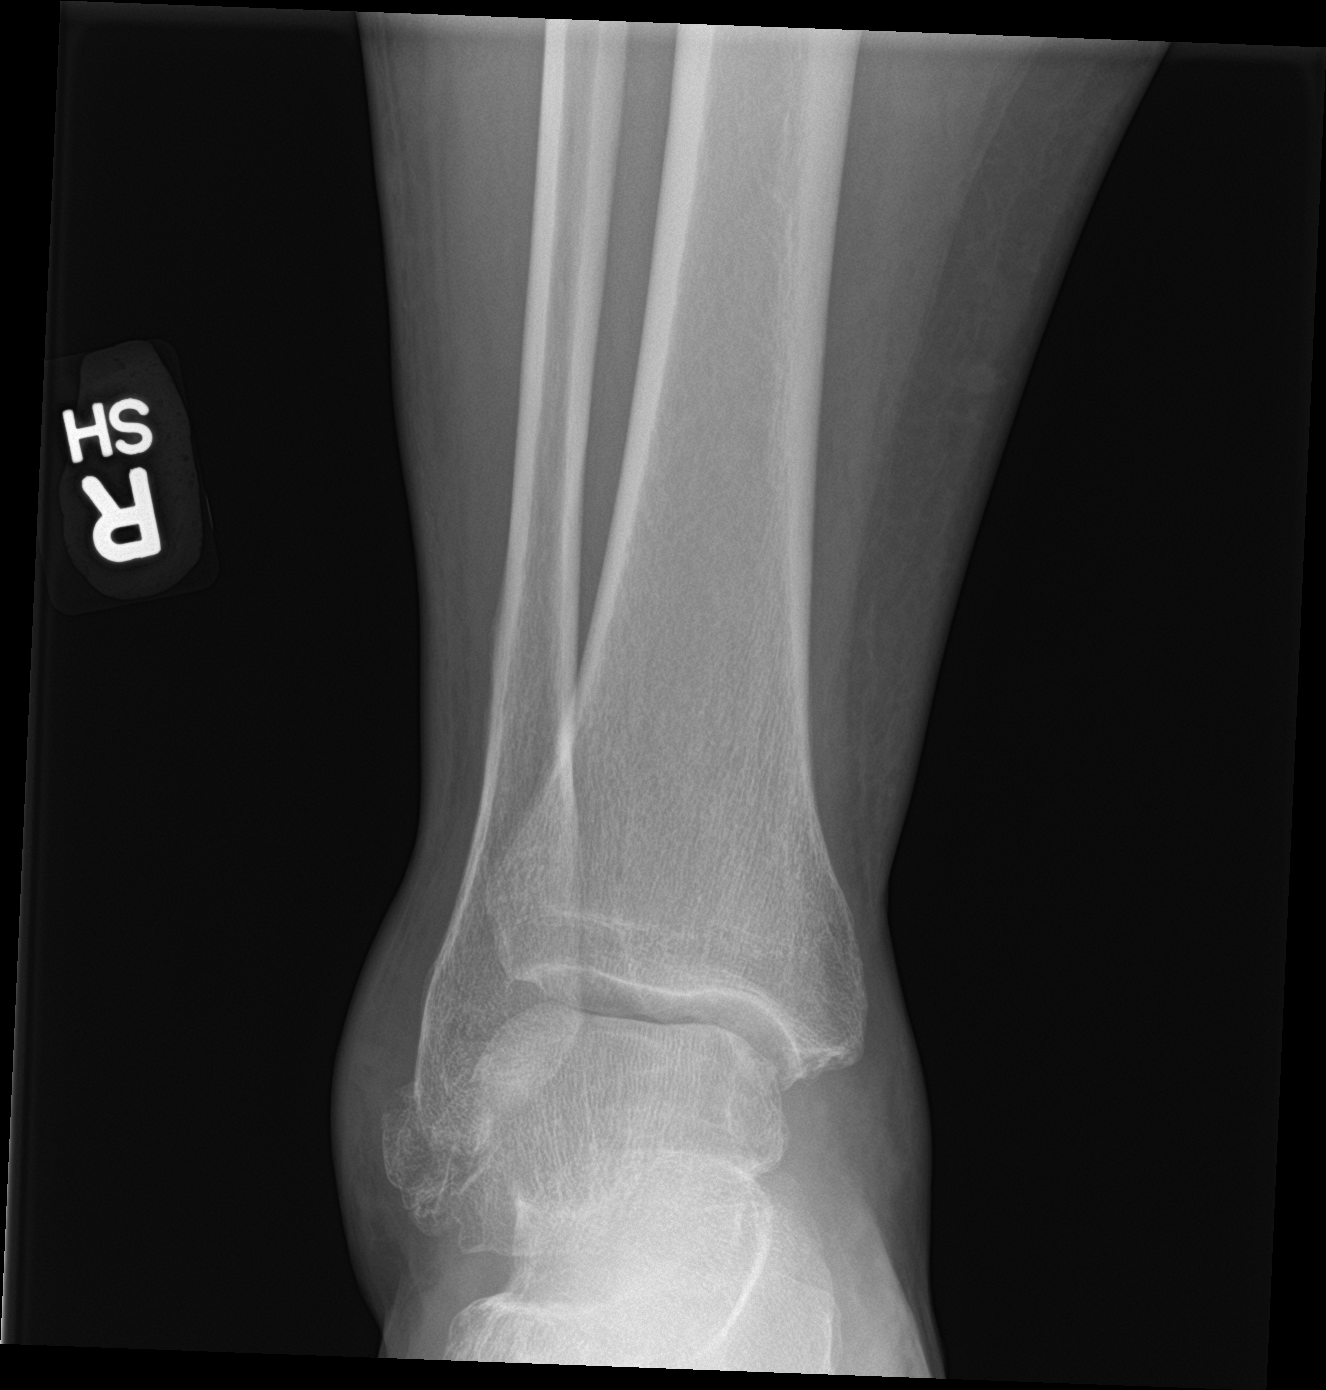

[ankle obl]
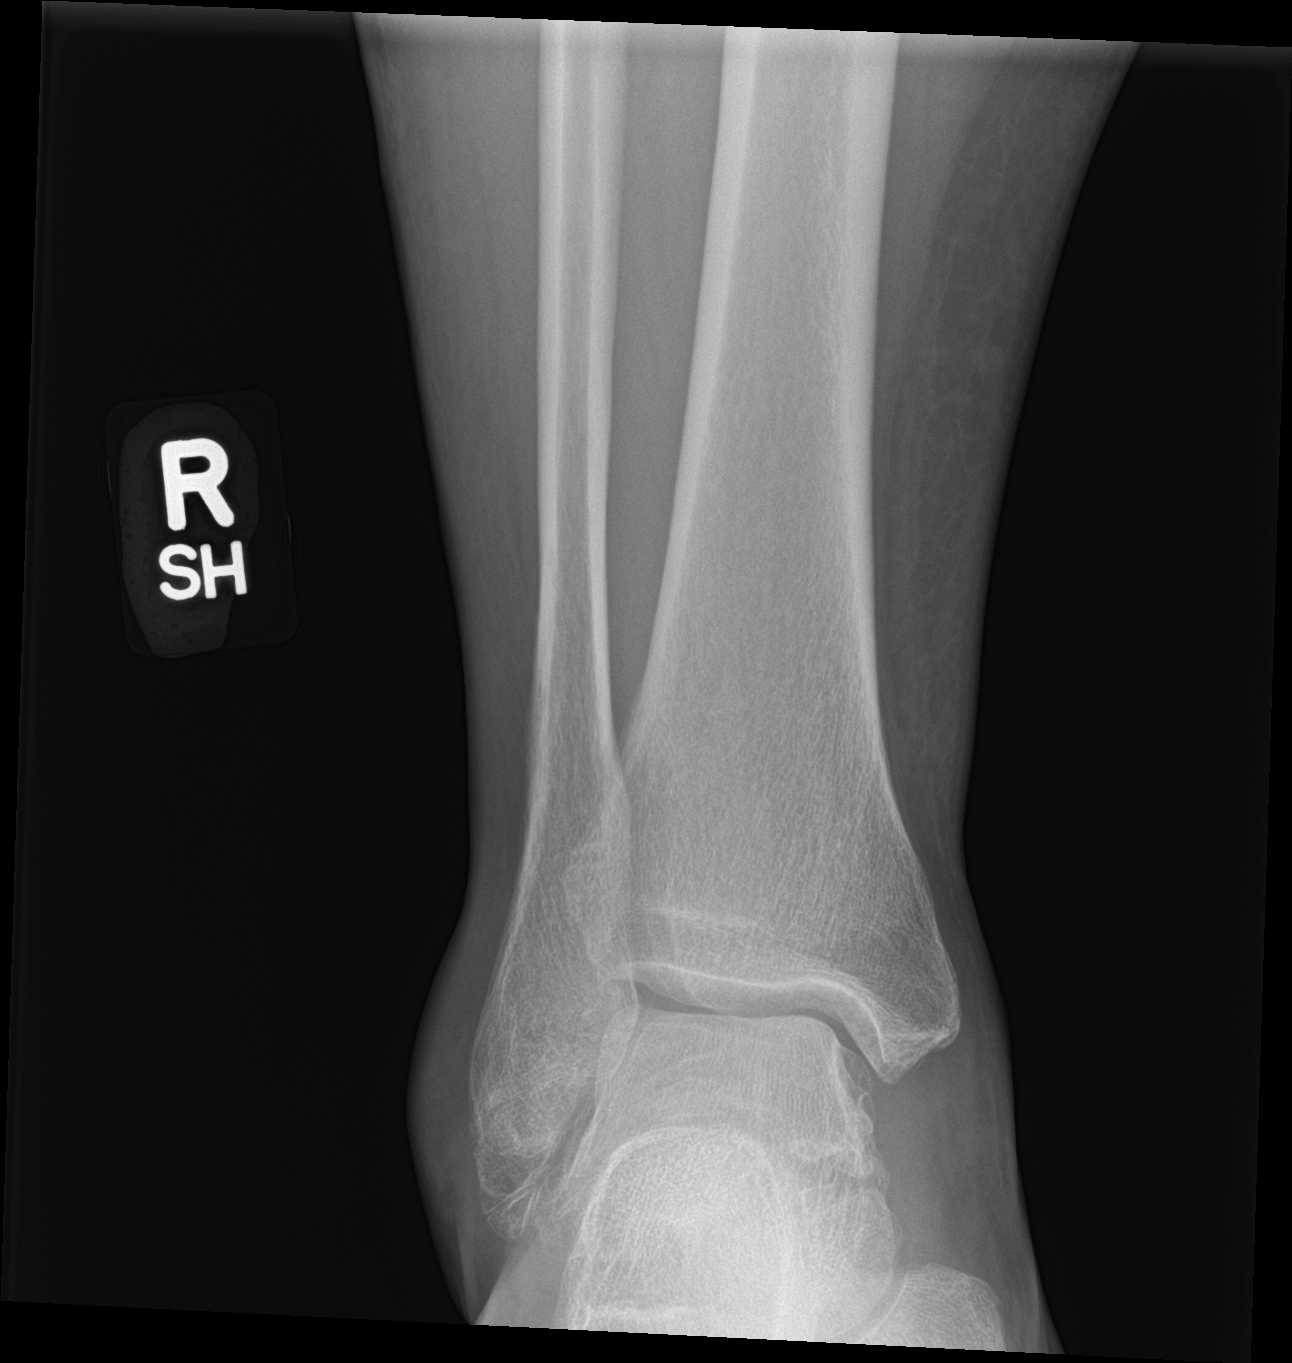

[ankle lat]
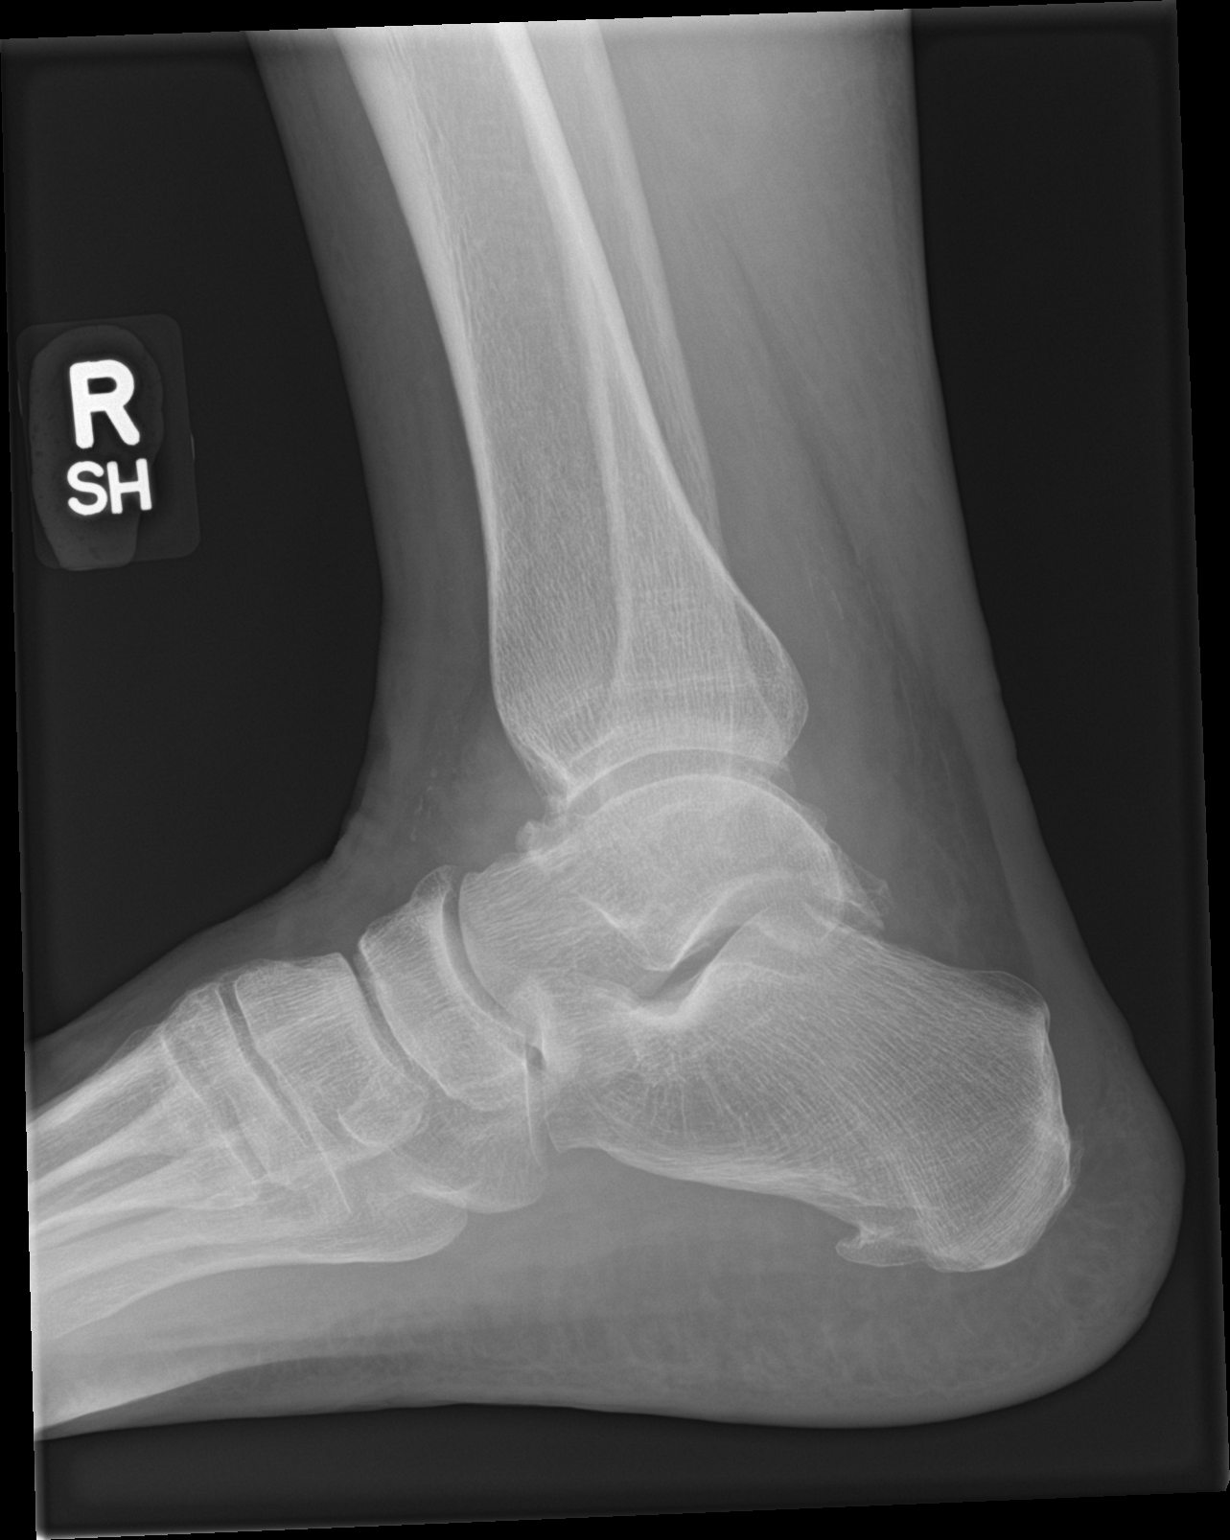

[3 of 3 positions shown; findings below may reference images not displayed]

FINDINGS: Acute fracture deformity is seen along the distal tip of the right
lateral malleolus. There is no evidence of dislocation. There is
mild to moderate severity diffuse soft tissue swelling.
IMPRESSION: 1. Acute fracture of the distal right lateral malleolus.
2. Mild to moderate severity diffuse soft tissue swelling.

## 2022-08-22 IMAGING — DX DG CERVICAL SPINE COMPLETE 4+V
6 series · 6 of 6 positions shown · non-contrast
Comparison: None.

CLINICAL DATA: Chronic neck pain and ankle swelling.

EXAM:
CERVICAL SPINE - COMPLETE 4+ VIEW

[c-spine lat]
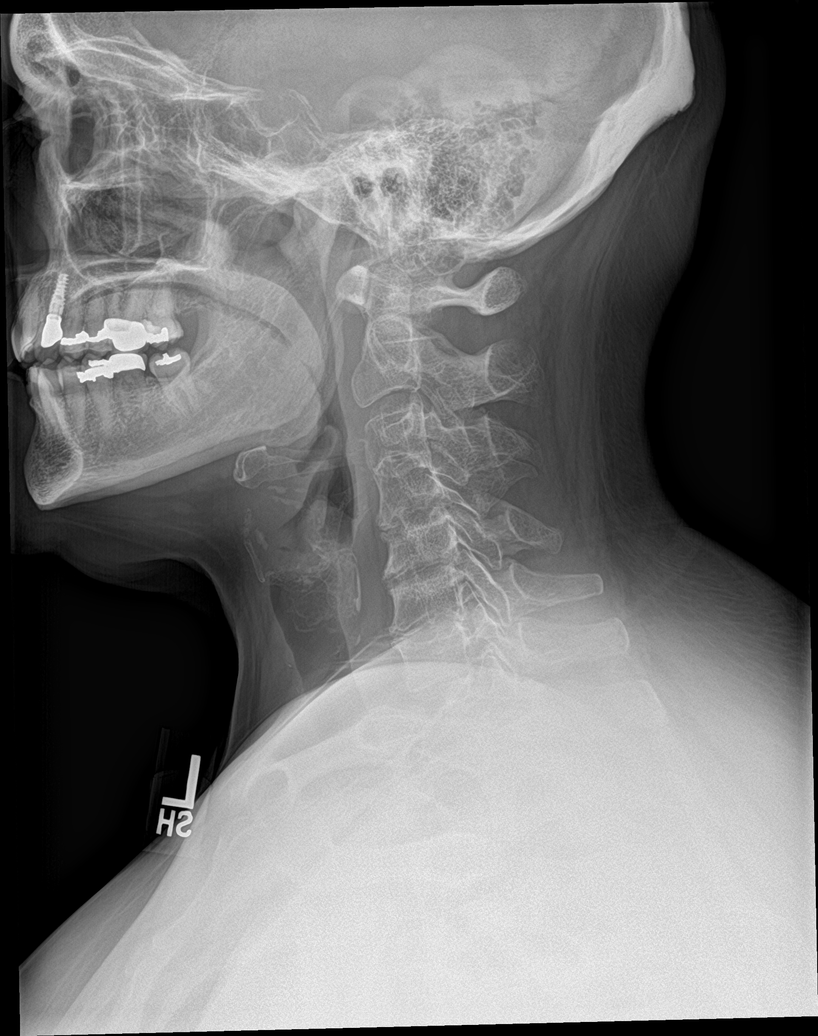

[c-spine obl (1 of 2)]
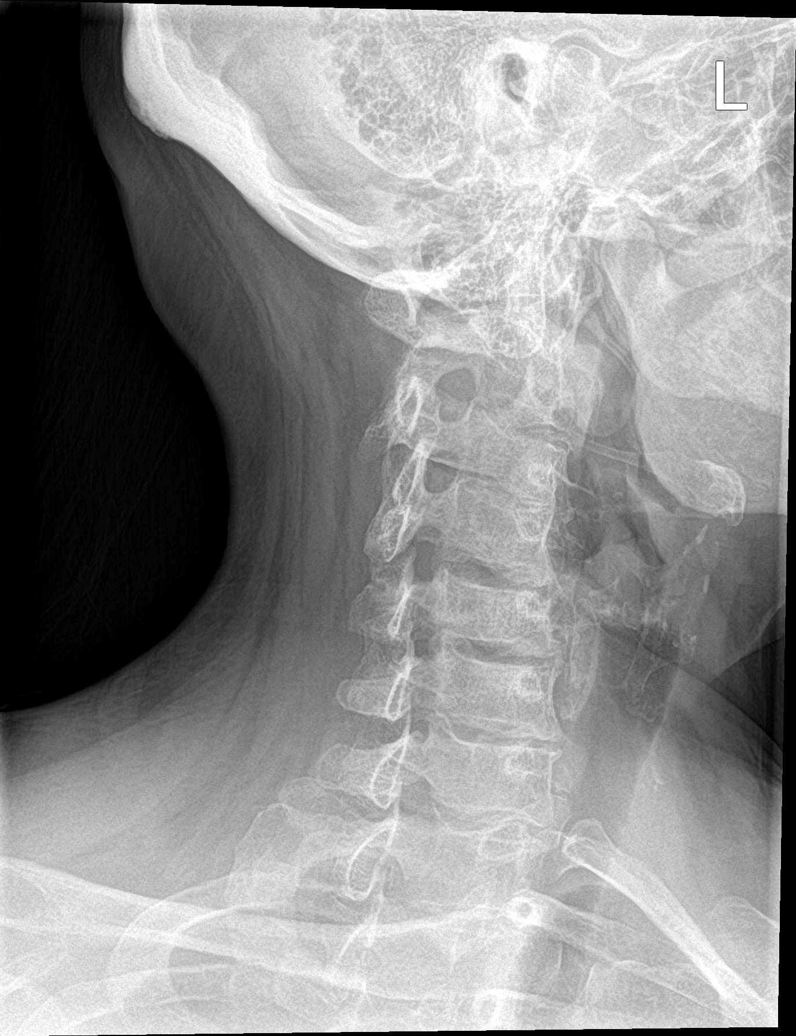

[c-spine obl (2 of 2)]
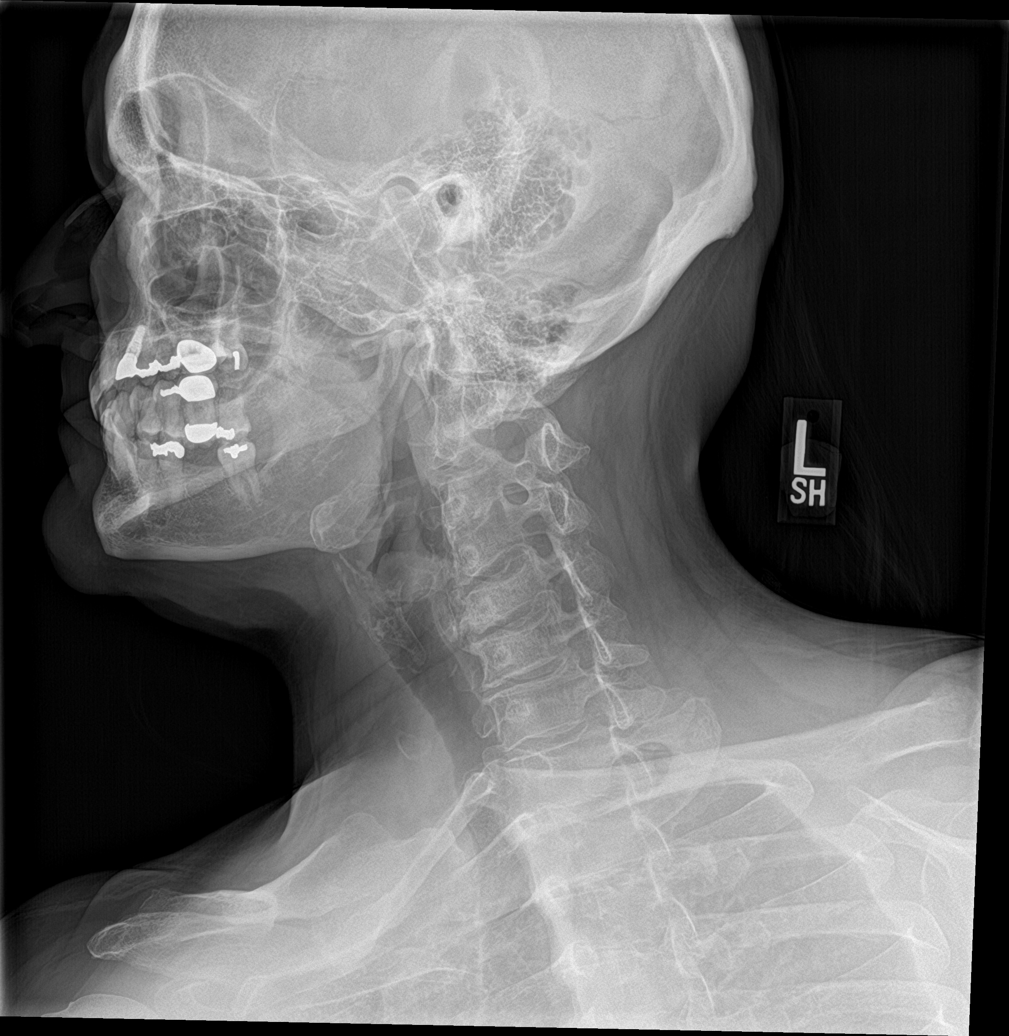

[c-spine ap]
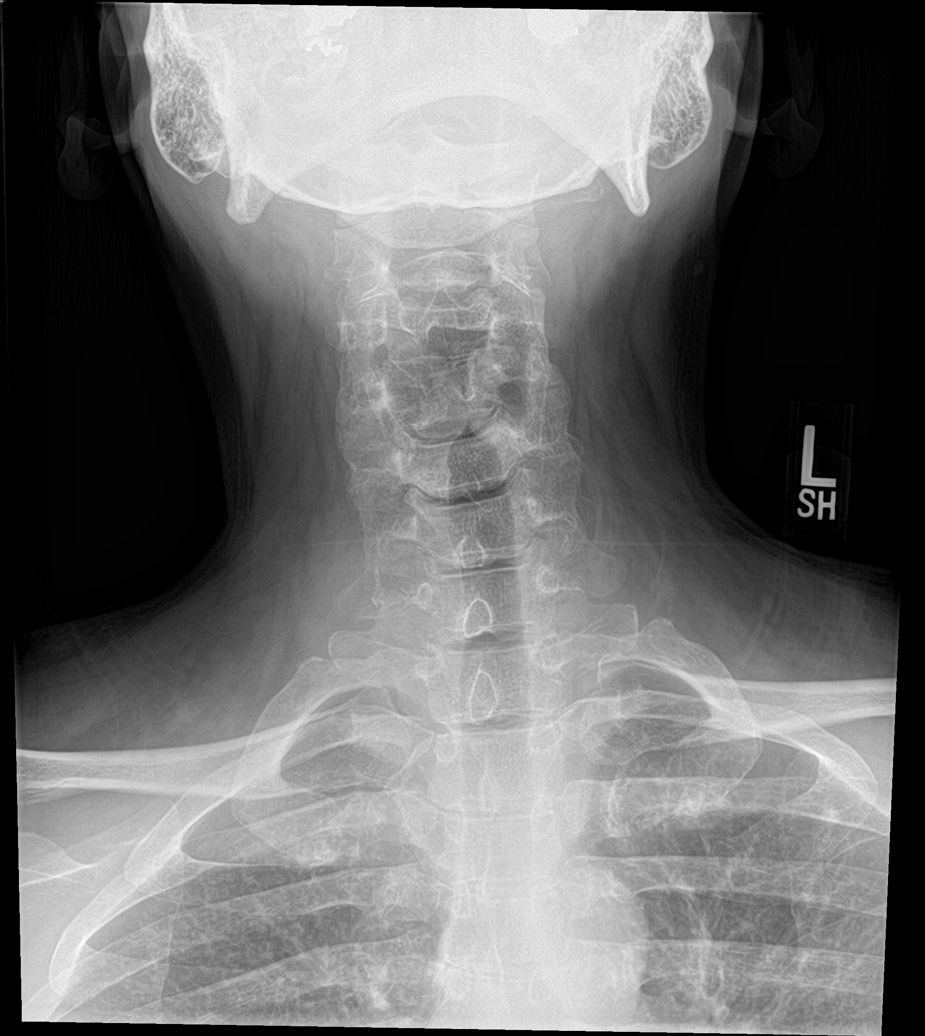

[c-spine open mouth]
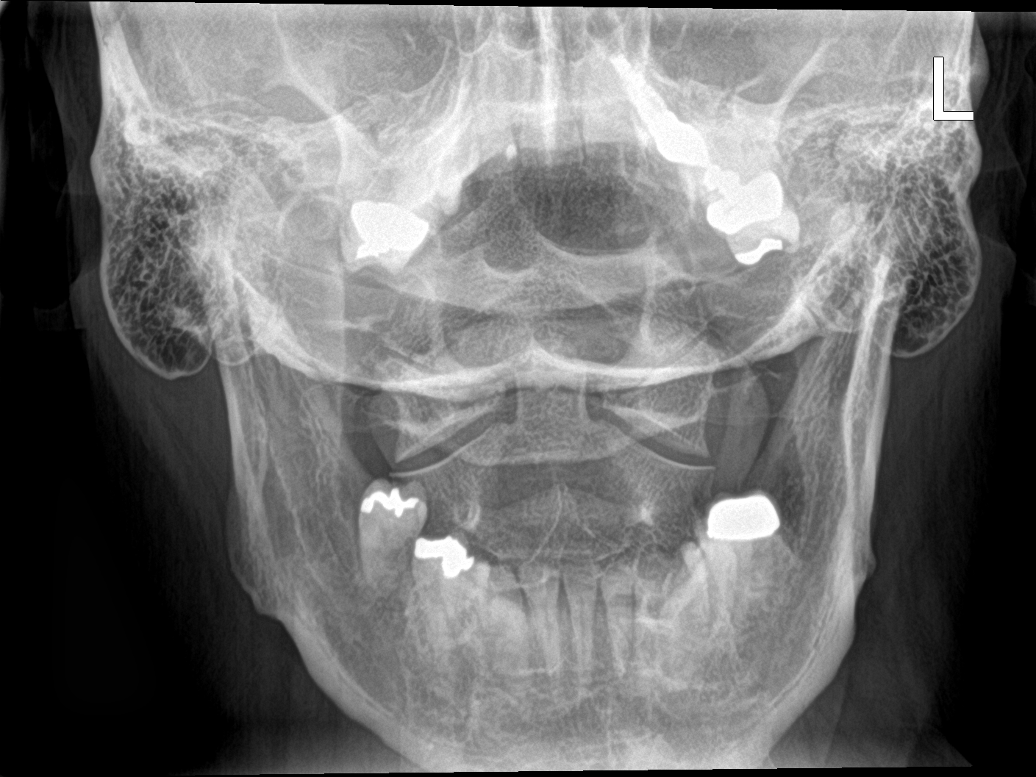

[c-spine swimmers]
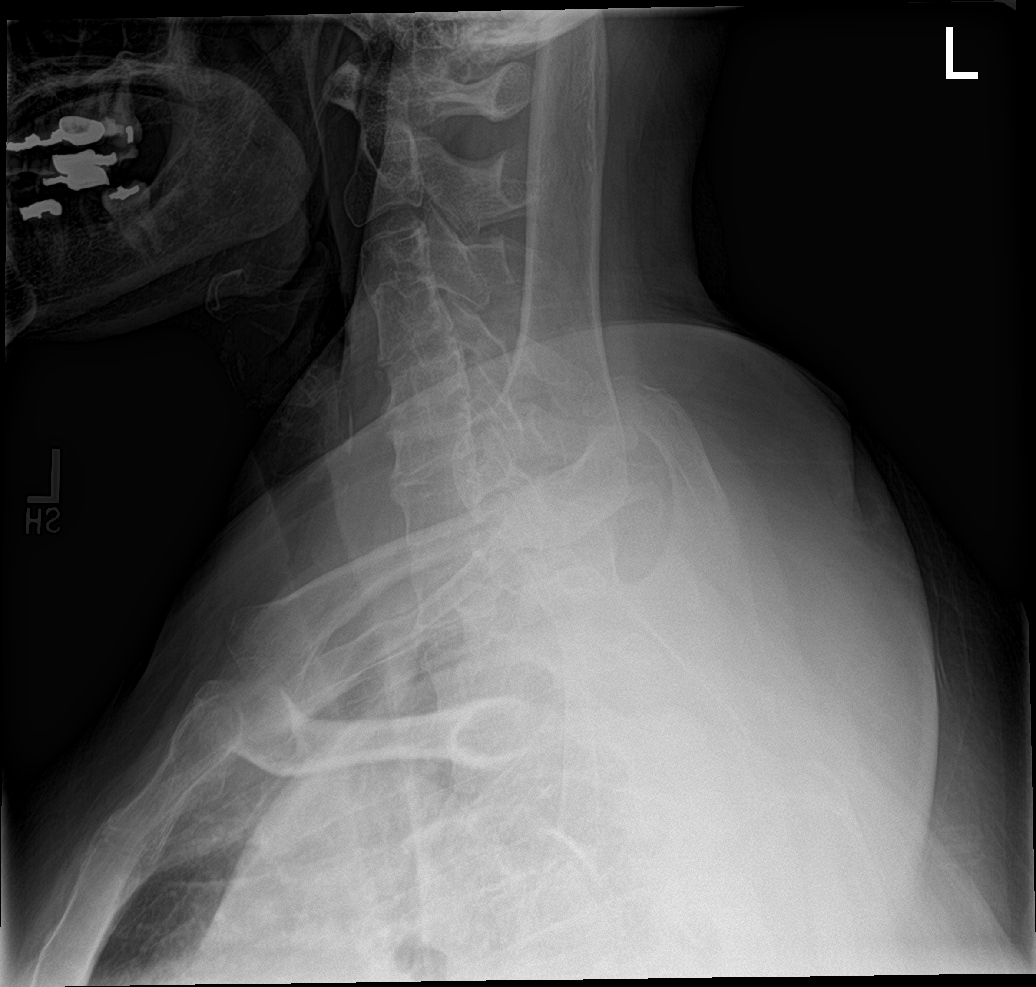

[6 of 6 positions shown; findings below may reference images not displayed]

FINDINGS: There is no evidence of cervical spine fracture or prevertebral soft
tissue swelling. There is straightening of the normal cervical spine
lordosis. Moderate to marked severity endplate sclerosis is seen at
the levels of C3-C4, C4-C5, C5-C6 and C6-C7. Marked severity
intervertebral disc space narrowing is noted at the level of C3-C4.
No other significant bone abnormalities are identified.
IMPRESSION: Moderate to marked severity degenerative changes at the levels of
C3-C4, C4-C5, C5-C6 and C6-C7.

## 2022-08-23 ENCOUNTER — Other Ambulatory Visit (HOSPITAL_COMMUNITY): Payer: Self-pay

## 2022-08-23 MED ORDER — TREMFYA 100 MG/ML ~~LOC~~ SOAJ
SUBCUTANEOUS | 1 refills | Status: DC
Start: 2022-08-12 — End: 2022-08-25

## 2022-08-24 ENCOUNTER — Other Ambulatory Visit (HOSPITAL_COMMUNITY): Payer: Self-pay

## 2022-08-25 ENCOUNTER — Ambulatory Visit: Payer: 59 | Attending: Medical-Surgical | Admitting: Pharmacist

## 2022-08-25 ENCOUNTER — Other Ambulatory Visit (HOSPITAL_COMMUNITY): Payer: Self-pay

## 2022-08-25 DIAGNOSIS — Z79899 Other long term (current) drug therapy: Secondary | ICD-10-CM

## 2022-08-25 MED ORDER — TREMFYA 100 MG/ML ~~LOC~~ SOAJ
SUBCUTANEOUS | 1 refills | Status: DC
  Filled 2022-08-25: qty 1, fill #0
  Filled 2022-10-20 – 2022-12-02 (×2): qty 1, 56d supply, fill #0
  Filled 2023-01-27: qty 1, 56d supply, fill #1

## 2022-08-25 MED ORDER — TREMFYA 100 MG/ML ~~LOC~~ SOSY
PREFILLED_SYRINGE | SUBCUTANEOUS | 0 refills | Status: DC
  Filled 2022-08-25: qty 1, fill #0
  Filled 2022-08-30: qty 1, 30d supply, fill #0

## 2022-08-25 NOTE — Progress Notes (Signed)
   S: Patient presents today for review of their specialty medication.   Patient is currently taking Tremfya for plaque psoriasis. Patient is managed by Marella Chimes for this.   Dosing: Plaque psoriasis: SubQ: 100 mg at weeks 0, 4, and then every 8 weeks thereafter.  Adherence: confirms. Received her first injection recently.  Efficacy: reports that she can already tell a decrease in outbreak duration.   Monitoring:  S/sx of infection: none S/sx of hypersensitivity: none  Current adverse effects: none reported    O:     Lab Results  Component Value Date   WBC 10.9 (H) 08/12/2022   HGB 15.3 08/12/2022   HCT 46.2 (H) 08/12/2022   MCV 91.5 08/12/2022   PLT 362 08/12/2022      Chemistry      Component Value Date/Time   NA 141 08/12/2022 0000   K 4.1 08/12/2022 0000   CL 106 08/12/2022 0000   CO2 30 08/12/2022 0000   BUN 11 08/12/2022 0000   CREATININE 0.66 08/12/2022 0000      Component Value Date/Time   CALCIUM 9.4 08/12/2022 0000   ALKPHOS 122 (H) 01/04/2018 1318   AST 14 08/12/2022 0000   ALT 15 08/12/2022 0000   BILITOT 0.8 08/12/2022 0000       A/P: 1. Medication review: patient currently on Tremfya for plaque psoriasis. Reviewed the medication with the patient, including the following: Tremfya is a medication used in the treatment of plaque psoriasis. Administer SubQ into front of thighs, lower abdomen (except for 2 inches around navel), or back of upper arms; do not inject into areas where the skin is tender, bruised, red, hard, thick, scaly, or affected by psoriasis. Possible adverse reactions include increased risk of infection, headache, and hypersensitivity reactions. Live vaccinations should be avoided. No recommendations for any changes.  Benard Halsted, PharmD, [facility], [place] [phone].

## 2022-08-30 ENCOUNTER — Other Ambulatory Visit (HOSPITAL_COMMUNITY): Payer: Self-pay

## 2022-09-01 ENCOUNTER — Other Ambulatory Visit (HOSPITAL_COMMUNITY): Payer: Self-pay

## 2022-09-02 ENCOUNTER — Other Ambulatory Visit (HOSPITAL_COMMUNITY): Payer: Self-pay

## 2022-09-06 ENCOUNTER — Other Ambulatory Visit (HOSPITAL_COMMUNITY): Payer: Self-pay

## 2022-09-07 ENCOUNTER — Other Ambulatory Visit (HOSPITAL_COMMUNITY): Payer: Self-pay

## 2022-10-20 ENCOUNTER — Other Ambulatory Visit (HOSPITAL_COMMUNITY): Payer: Self-pay

## 2022-10-21 ENCOUNTER — Other Ambulatory Visit (HOSPITAL_COMMUNITY): Payer: Self-pay

## 2022-10-22 ENCOUNTER — Other Ambulatory Visit (HOSPITAL_COMMUNITY): Payer: Self-pay

## 2022-11-09 DIAGNOSIS — L403 Pustulosis palmaris et plantaris: Secondary | ICD-10-CM | POA: Diagnosis not present

## 2022-11-09 DIAGNOSIS — Z5181 Encounter for therapeutic drug level monitoring: Secondary | ICD-10-CM | POA: Diagnosis not present

## 2022-11-09 DIAGNOSIS — M2550 Pain in unspecified joint: Secondary | ICD-10-CM | POA: Diagnosis not present

## 2022-11-10 ENCOUNTER — Other Ambulatory Visit (HOSPITAL_COMMUNITY): Payer: Self-pay

## 2022-11-12 ENCOUNTER — Other Ambulatory Visit (HOSPITAL_COMMUNITY): Payer: Self-pay

## 2022-11-15 ENCOUNTER — Other Ambulatory Visit (HOSPITAL_COMMUNITY): Payer: Self-pay

## 2022-11-15 MED ORDER — CLOBETASOL PROPIONATE 0.05 % EX OINT
TOPICAL_OINTMENT | CUTANEOUS | 0 refills | Status: DC
  Filled 2022-11-15: qty 60, 15d supply, fill #0

## 2022-11-16 ENCOUNTER — Other Ambulatory Visit (HOSPITAL_COMMUNITY): Payer: Self-pay

## 2022-11-16 ENCOUNTER — Ambulatory Visit: Payer: 59 | Admitting: Medical-Surgical

## 2022-11-16 ENCOUNTER — Other Ambulatory Visit (HOSPITAL_COMMUNITY)
Admission: RE | Admit: 2022-11-16 | Discharge: 2022-11-16 | Disposition: A | Discharge status: Home / Self Care | Payer: 59 | Source: Ambulatory Visit | Attending: Medical-Surgical | Admitting: Medical-Surgical

## 2022-11-16 ENCOUNTER — Encounter: Payer: Self-pay | Admitting: Medical-Surgical

## 2022-11-16 ENCOUNTER — Ambulatory Visit (INDEPENDENT_AMBULATORY_CARE_PROVIDER_SITE_OTHER): Payer: 59

## 2022-11-16 VITALS — BP 122/78 | HR 70 | Resp 20 | Ht 66.0 in | Wt 246.5 lb

## 2022-11-16 DIAGNOSIS — F5101 Primary insomnia: Secondary | ICD-10-CM | POA: Diagnosis not present

## 2022-11-16 DIAGNOSIS — M5412 Radiculopathy, cervical region: Secondary | ICD-10-CM

## 2022-11-16 DIAGNOSIS — Z8616 Personal history of COVID-19: Secondary | ICD-10-CM | POA: Diagnosis not present

## 2022-11-16 DIAGNOSIS — Z111 Encounter for screening for respiratory tuberculosis: Secondary | ICD-10-CM

## 2022-11-16 DIAGNOSIS — Z13220 Encounter for screening for lipoid disorders: Secondary | ICD-10-CM | POA: Diagnosis not present

## 2022-11-16 DIAGNOSIS — Z1159 Encounter for screening for other viral diseases: Secondary | ICD-10-CM | POA: Diagnosis not present

## 2022-11-16 DIAGNOSIS — R053 Chronic cough: Secondary | ICD-10-CM

## 2022-11-16 DIAGNOSIS — Z1231 Encounter for screening mammogram for malignant neoplasm of breast: Secondary | ICD-10-CM

## 2022-11-16 DIAGNOSIS — R059 Cough, unspecified: Secondary | ICD-10-CM | POA: Diagnosis not present

## 2022-11-16 DIAGNOSIS — Z124 Encounter for screening for malignant neoplasm of cervix: Secondary | ICD-10-CM | POA: Insufficient documentation

## 2022-11-16 DIAGNOSIS — Z114 Encounter for screening for human immunodeficiency virus [HIV]: Secondary | ICD-10-CM

## 2022-11-16 DIAGNOSIS — Z87891 Personal history of nicotine dependence: Secondary | ICD-10-CM

## 2022-11-16 DIAGNOSIS — I10 Essential (primary) hypertension: Secondary | ICD-10-CM

## 2022-11-16 DIAGNOSIS — F418 Other specified anxiety disorders: Secondary | ICD-10-CM

## 2022-11-16 DIAGNOSIS — E669 Obesity, unspecified: Secondary | ICD-10-CM | POA: Diagnosis not present

## 2022-11-16 DIAGNOSIS — Z1211 Encounter for screening for malignant neoplasm of colon: Secondary | ICD-10-CM

## 2022-11-16 MED ORDER — LOSARTAN POTASSIUM 100 MG PO TABS
100.0000 mg | ORAL_TABLET | Freq: Every day | ORAL | 3 refills | Status: DC
  Filled 2022-11-16: qty 90, 90d supply, fill #0
  Filled 2023-02-15: qty 90, 90d supply, fill #1
  Filled 2023-10-31: qty 90, 90d supply, fill #2

## 2022-11-16 MED ORDER — ALPRAZOLAM 0.25 MG PO TABS
0.1250 mg | ORAL_TABLET | Freq: Every day | ORAL | 0 refills | Status: DC | PRN
  Filled 2022-11-16: qty 30, 30d supply, fill #0

## 2022-11-16 NOTE — Progress Notes (Unsigned)
Established Patient Office Visit  Subjective   Patient ID: Leslie Wiggins, female   DOB: October 31, 1971 Age: 51 y.o. MRN: [id number]   Chief Complaint  Patient presents with   Follow-up   Mood   Hypertension    HPI Pleasant 51 year old female presenting today for the following:  Pap smear: overdue for pap smear and would like to get that done today.   Hepatitis C screening showing due on health maintenance. Would like to get that taken care of today.   Reports that since she had COVID in 2020, she has had episodes where she coughs up black sputum. This happens when she gets a bit dehydrated. No other symptoms associated. Former smoker of about 1ppd cigarettes for about 30 years.   Mood/insomnia: still struggling with grief after losing her mom. Her mom lived with her and was a Ship broker. She is now having to go through all of her mother's possessions but it is slow going. Notes that she will start working on it but she begins to cry. Once that happens, she stops in order to calm herself down. Finds herself working more so that she doesn't have to be home and face all the stuff that is there. Not doing counseling. Not interested in a maintenance medication. Using Xanax prn severe anxiety. Requesting a refill.   HTN: taking losartan '100mg'$  daily, tolerating well without side effects. Denies CP, SOB, palpitations, lower extremity edema, dizziness, headaches, or vision changes.    Objective:    Vitals:   11/16/22 1418  BP: 122/78  Pulse: 70  Resp: 20  Height: '5\' 6"'$  (1.676 m)  Weight: 246 lb 8 oz (111.8 kg)  SpO2: 96%  BMI (Calculated): 39.81    Physical Exam Vitals and nursing note reviewed. Exam conducted with a chaperone present.  Constitutional:      General: She is not in acute distress.    Appearance: Normal appearance. She is obese. She is not ill-appearing.  HENT:     Head: Normocephalic and atraumatic.  Cardiovascular:     Rate and Rhythm: Normal rate and regular  rhythm.     Pulses: Normal pulses.     Heart sounds: Normal heart sounds.  Pulmonary:     Effort: Pulmonary effort is normal. No respiratory distress.     Breath sounds: Normal breath sounds. No wheezing, rhonchi or rales.  Genitourinary:    Exam position: Lithotomy position.     Pubic Area: No rash.      Labia:        Right: No rash, tenderness, lesion or injury.        Left: No rash, tenderness, lesion or injury.      Vagina: Normal.     Cervix: Normal.     Uterus: Normal.      Adnexa: Right adnexa normal and left adnexa normal.  Skin:    General: Skin is warm and dry.  Neurological:     Mental Status: She is alert and oriented to person, place, and time.  Psychiatric:        Mood and Affect: Mood normal.        Behavior: Behavior normal.        Thought Content: Thought content normal.        Judgment: Judgment normal.   No results found for this or any previous visit (from the past 24 hour(s)).     The 10-year ASCVD risk score (Arnett DK, et al., 2019) is: 1.5%   Values used to calculate  the score:     Age: 28 years     Sex: Female     Is Non-Hispanic African American: No     Diabetic: No     Tobacco smoker: No     Systolic Blood Pressure: 269 mmHg     Is BP treated: Yes     HDL Cholesterol: 63 mg/dL     Total Cholesterol: 190 mg/dL   Assessment & Plan:   1. Situational anxiety Would prefer to have her on a maintenance medication for management of symptoms but patient not interested in starting anything today. Reviewed Xanax use and the recommendations for only using it short term. Reviewed the risk of tolerance and dependence. Patient agrees to very sparing use so refill sent to get her through the holidays. Plan to revisit this next year. Offered counseling, patient declined.  - ALPRAZolam (XANAX) 0.25 MG tablet; Take 1/2 - 1 tablet (0.125 - 0.25 mg total) by mouth daily as needed for anxiety  Dispense: 30 tablet; Refill: 0  2. Primary hypertension Checking labs.  Continue losartan '100mg'$  daily. BP at goal. Continue to monitor BP at home with a goal of 130/80 or less. Recommend low sodium diet, regular intentional exercise, and maintaining a healthy weight.  - losartan (COZAAR) 100 MG tablet; Take 1 tablet by mouth daily.  Dispense: 90 tablet; Refill: 3 - CBC with Differential/Platelet - COMPLETE METABOLIC PANEL WITH GFR  3. Obesity without serious comorbidity, unspecified classification, unspecified obesity type Checking A1c.  - Hemoglobin A1c  4. Primary insomnia Continue Trazdone. Avoid use of Xanax for sleep.   5. Radiculitis of right cervical region Referring to PT>  - Ambulatory referral to Physical Therapy  6. Colon cancer screening Referring to GI for colonoscopy.  - Ambulatory referral to Gastroenterology  7. Encounter for screening mammogram for malignant neoplasm of breast Mammogram ordered.  - MM 3D SCREEN BREAST BILATERAL; Future  8. Chronic cough Chest x-ray today. Plan for CT lung cancer screening. Unclear etiology. No symptoms or sputum production today. Exam normal.  - DG Chest 2 View; Future - CT CHEST LUNG CA SCREEN LOW DOSE W/O CM; Future  9. Lipid screening Checking lipids. - Lipid panel  10. Screening-pulmonary TB Quantiferon gold today.  - QuantiFERON-TB Gold Plus  11. Encounter for screening for HIV HIV screening today.  - HIV Antibody (routine testing w rflx)  12. Need for hepatitis C screening test Hepatitis C screening today.  - Hepatitis C Antibody  13. Former smoker CT chest as above.  - CT CHEST LUNG CA SCREEN LOW DOSE W/O CM; Future  14. Pap smear for cervical cancer screening Pap smear with HPV cotesting today. - Cytology - PAP  Return in about 6 months (around 05/18/2023) for mood follow up.  ___________________________________________ Clearnce Sorrel, DNP, APRN, FNP-BC [facility] [place]

## 2022-11-17 ENCOUNTER — Ambulatory Visit (INDEPENDENT_AMBULATORY_CARE_PROVIDER_SITE_OTHER): Payer: 59

## 2022-11-17 ENCOUNTER — Other Ambulatory Visit (HOSPITAL_COMMUNITY): Payer: Self-pay

## 2022-11-17 DIAGNOSIS — Z1231 Encounter for screening mammogram for malignant neoplasm of breast: Secondary | ICD-10-CM | POA: Diagnosis not present

## 2022-11-18 ENCOUNTER — Ambulatory Visit (INDEPENDENT_AMBULATORY_CARE_PROVIDER_SITE_OTHER): Payer: 59

## 2022-11-18 ENCOUNTER — Encounter: Payer: Self-pay | Admitting: Medical-Surgical

## 2022-11-18 DIAGNOSIS — Z122 Encounter for screening for malignant neoplasm of respiratory organs: Secondary | ICD-10-CM

## 2022-11-18 DIAGNOSIS — R053 Chronic cough: Secondary | ICD-10-CM

## 2022-11-18 DIAGNOSIS — Z87891 Personal history of nicotine dependence: Secondary | ICD-10-CM | POA: Diagnosis not present

## 2022-11-18 DIAGNOSIS — J439 Emphysema, unspecified: Secondary | ICD-10-CM | POA: Insufficient documentation

## 2022-11-18 DIAGNOSIS — I700 Atherosclerosis of aorta: Secondary | ICD-10-CM | POA: Insufficient documentation

## 2022-11-18 LAB — COMPLETE METABOLIC PANEL WITH GFR
AG Ratio: 1.5 (calc) (ref 1.0–2.5)
ALT: 18 U/L (ref 6–29)
AST: 13 U/L (ref 10–35)
Albumin: 4.3 g/dL (ref 3.6–5.1)
Alkaline phosphatase (APISO): 116 U/L (ref 37–153)
BUN: 11 mg/dL (ref 7–25)
CO2: 28 mmol/L (ref 20–32)
Calcium: 9.5 mg/dL (ref 8.6–10.4)
Chloride: 106 mmol/L (ref 98–110)
Creat: 0.58 mg/dL (ref 0.50–1.03)
Globulin: 2.9 g/dL (calc) (ref 1.9–3.7)
Glucose, Bld: 84 mg/dL (ref 65–99)
Potassium: 4.4 mmol/L (ref 3.5–5.3)
Sodium: 141 mmol/L (ref 135–146)
Total Bilirubin: 0.8 mg/dL (ref 0.2–1.2)
Total Protein: 7.2 g/dL (ref 6.1–8.1)
eGFR: 109 mL/min/{1.73_m2} (ref >=60)

## 2022-11-18 LAB — CBC WITH DIFFERENTIAL/PLATELET
Absolute Monocytes: 693 cells/uL (ref 200–950)
Basophils Absolute: 55 cells/uL (ref 0–200)
Basophils Relative: 0.5 %
Eosinophils Absolute: 132 cells/uL (ref 15–500)
Eosinophils Relative: 1.2 %
HCT: 44.1 % (ref 35.0–45.0)
Hemoglobin: 15.2 g/dL (ref 11.7–15.5)
Lymphs Abs: 3630 cells/uL (ref 850–3900)
MCH: 31 pg (ref 27.0–33.0)
MCHC: 34.5 g/dL (ref 32.0–36.0)
MCV: 89.8 fL (ref 80.0–100.0)
MPV: 9.6 fL (ref 7.5–12.5)
Monocytes Relative: 6.3 %
Neutro Abs: 6490 cells/uL (ref 1500–7800)
Neutrophils Relative %: 59 %
Platelets: 365 10*3/uL (ref 140–400)
RBC: 4.91 10*6/uL (ref 3.80–5.10)
RDW: 12.5 % (ref 11.0–15.0)
Total Lymphocyte: 33 %
WBC: 11 10*3/uL — ABNORMAL HIGH (ref 3.8–10.8)

## 2022-11-18 LAB — LIPID PANEL
Cholesterol: 190 mg/dL (ref <200)
HDL: 63 mg/dL (ref >=50)
LDL Cholesterol (Calc): 107 mg/dL (calc) — ABNORMAL HIGH
Non-HDL Cholesterol (Calc): 127 mg/dL (calc) (ref <130)
Total CHOL/HDL Ratio: 3 (calc) (ref <5.0)
Triglycerides: 100 mg/dL (ref <150)

## 2022-11-18 LAB — HEMOGLOBIN A1C
Hgb A1c MFr Bld: 5.6 % of total Hgb (ref <5.7)
Mean Plasma Glucose: 114 mg/dL
eAG (mmol/L): 6.3 mmol/L

## 2022-11-18 LAB — QUANTIFERON-TB GOLD PLUS
Mitogen-NIL: >10 IU/mL
NIL: 0.01 IU/mL
QuantiFERON-TB Gold Plus: NEGATIVE
TB1-NIL: 0 IU/mL
TB2-NIL: 0 IU/mL

## 2022-11-18 LAB — HIV ANTIBODY (ROUTINE TESTING W REFLEX): HIV 1&2 Ab, 4th Generation: NONREACTIVE

## 2022-11-19 LAB — CYTOLOGY - PAP
Comment: NEGATIVE
Diagnosis: NEGATIVE
High risk HPV: NEGATIVE

## 2022-12-02 ENCOUNTER — Other Ambulatory Visit (HOSPITAL_COMMUNITY): Payer: Self-pay

## 2022-12-02 ENCOUNTER — Encounter: Payer: Self-pay | Admitting: Gastroenterology

## 2022-12-07 ENCOUNTER — Encounter: Payer: Self-pay | Admitting: Rehabilitative and Restorative Service Providers"

## 2022-12-07 ENCOUNTER — Ambulatory Visit
Payer: Commercial Managed Care - PPO | Attending: Medical-Surgical | Admitting: Rehabilitative and Restorative Service Providers"

## 2022-12-07 ENCOUNTER — Other Ambulatory Visit: Payer: Self-pay

## 2022-12-07 ENCOUNTER — Other Ambulatory Visit (HOSPITAL_COMMUNITY): Payer: Self-pay

## 2022-12-07 DIAGNOSIS — R293 Abnormal posture: Secondary | ICD-10-CM | POA: Diagnosis not present

## 2022-12-07 DIAGNOSIS — M5412 Radiculopathy, cervical region: Secondary | ICD-10-CM | POA: Insufficient documentation

## 2022-12-07 DIAGNOSIS — R29898 Other symptoms and signs involving the musculoskeletal system: Secondary | ICD-10-CM | POA: Insufficient documentation

## 2022-12-07 NOTE — Therapy (Signed)
OUTPATIENT PHYSICAL THERAPY CERVICAL EVALUATION   Patient Name: Leslie Wiggins MRN: [id number] DOB:1971-02-26, 52 y.o., female Today's Date: 12/07/2022  END OF SESSION:  PT End of Session - 12/07/22 1445     Visit Number 1    Number of Visits 16    Date for PT Re-Evaluation 02/01/23    PT Start Time 7106    PT Stop Time 2694    PT Time Calculation (min) 48 min    Activity Tolerance Patient tolerated treatment well             Past Medical History:  Diagnosis Date   Anxiety    Depression    Hay fever    Hypertension    Past Surgical History:  Procedure Laterality Date   [place] EXTRACTION     Patient Active Problem List   Diagnosis Date Noted   Pulmonary emphysema ([place]) 11/18/2022   Aortic atherosclerosis ([place]) 11/18/2022   Situational anxiety 08/12/2022   Shift work sleep disorder 08/12/2022   High risk medication use 08/12/2022   Fracture of ankle, lateral malleolus, right, closed 12/24/2021   Radiculitis of right cervical region 12/23/2021   Chronic pain of right ankle 12/23/2021   Dyshidrotic eczema 07/09/2021   Primary hypertension 06/22/2019   Bronchitis 11/01/2018   Primary insomnia 11/01/2018   Pain of left heel 04/13/2018   Leukemoid reaction 05/23/2017   Obesity 02/26/2016    PCP: Samuel Bouche, NP  REFERRING PROVIDER: Samuel Bouche, NP  REFERRING DIAG: Cervical radiculitis  THERAPY DIAG:  Radiculopathy, cervical region - Plan: PT plan of care cert/re-cert  Other symptoms and signs involving the musculoskeletal system - Plan: PT plan of care cert/re-cert  Abnormal posture - Plan: PT plan of care cert/re-cert  Rationale for Evaluation and Treatment: Rehabilitation  ONSET DATE: 12/06/2021  SUBJECTIVE:                                                                                                                                                                                                          SUBJECTIVE STATEMENT: Patient reports she has numbness in Rt thumb, index and long fingers and sometimes whole hand. She can get some improvement in symptoms with bow and arrow movement. Symptoms have been present for over one year with no known injury. She feels the symptoms are related to ergonomics when using a computer.   PERTINENT HISTORY:  Arthritis; LBP; ESI low back; HTN; insomnia; 1 year history of Rt UE radiculopathy   PAIN:  Are you having pain?  Yes: NPRS scale: 0/10; at times mid to upper back pain 5-6/10 Pain location: mid to upper back  Pain description:  aching; dull  Aggravating factors: upper to mid back  Relieving factors: movement; meds   PRECAUTIONS: None  WEIGHT BEARING RESTRICTIONS: No  FALLS:  Has patient fallen in last 6 months? 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  PATIENT SURVEYS:  FOTO 50  COGNITION: Overall cognitive status: Within functional limits for tasks assessed  SENSATION: Numbness Rt thumb, index and long fingers at times whole hand - about 2-5 min of symptoms - relieved with 2-3 min of movement and exercises lasting for ~ 30 min -  may happen up to 10 times in every 12 hour work day   POSTURE: Patient presents with head forward posture with increased thoracic kyphosis; shoulders rounded and elevated; scapulae abducted and rotated along the thoracic spine; head of the humerus anterior in orientation.   PALPATION: Muscular tightness Rt >  Lt pecs; cervical musculature; thoracic paraspinals    CERVICAL ROM:   Active ROM A/PROM (deg) eval  Flexion 62  Extension 54  Right lateral flexion 30  Left lateral flexion 33  Right rotation 64  Left rotation 63   (Blank rows = not tested)  UPPER EXTREMITY ROM:  Shoulder ROM - WNL's in all planes   UPPER EXTREMITY MMT:    WNL's bilat    CERVICAL SPECIAL TESTS:  Compression/distraction; thoracic outlet tests bilat (-)    TODAY'S TREATMENT:                                                                                                                              [facility] Adult PT Treatment:                                                DATE: 12/07/22 Therapeutic Exercise: Doorway stretch, 3 positions 30 sec x 2  Chin tuck with noodle 10 sec x 10  Scap squeeze with noodle 10 sec x 10  Manual Therapy:  Neuromuscular re-ed: Working on posterior shoulder girdle control standing with noodle focus on control of posterior shoulder girdle with UE movement   Therapeutic Activity: Discussed importance of posture and alignment with as well as strengthening  Modalities:  Self Care: Use of noodle for sitting at work and in  car    PATIENT EDUCATION:  Education details: POC; HEP Person educated: Patient Education method: Consulting civil engineer, Demonstration, Corporate treasurer cues, Verbal cues, and Handouts Education comprehension: verbalized understanding, returned demonstration, verbal cues required, tactile cues required, and needs further education  HOME EXERCISE PROGRAM: Access Code: [email or web address] URL: [email or web address] Date: 12/07/2022 Prepared by: Gillermo Murdoch  Exercises - Doorway Pec Stretch at 60 Degrees Abduction  - 3 x daily - 7 x weekly - 1 sets - 3 reps - Doorway Pec Stretch at 90 Degrees Abduction  - 3 x daily - 7 x weekly - 1 sets - 3 reps - 30 seconds  hold - Doorway Pec Stretch at 120 Degrees Abduction  - 3 x daily - 7 x weekly - 1 sets - 3 reps - 30 second hold   hold - Seated Cervical Retraction  - 3 x daily - 7 x weekly - 1 sets - 10 reps - Standing Scapular Retraction  - 3 x daily - 7 x weekly - 1 sets - 10 reps - 10 hold - Shoulder External Rotation and Scapular Retraction  - 3 x daily - 7 x weekly - 1 sets - 10 reps -   hold  ASSESSMENT:  CLINICAL IMPRESSION: Patient is a 52 y.o. female who was seen today for physical therapy evaluation and treatment for cervical radiculopathy.   OBJECTIVE IMPAIRMENTS: decreased activity tolerance, decreased endurance, decreased mobility, decreased ROM, increased fascial restrictions, increased muscle spasms, impaired flexibility, impaired sensation, impaired UE functional use, improper body mechanics, postural dysfunction, and pain.   ACTIVITY LIMITATIONS: carrying, lifting, sitting, and computer work  PARTICIPATION LIMITATIONS: occupation  PERSONAL FACTORS: Fitness, Past/current experiences, Profession, Time since onset of injury/illness/exacerbation, and  comorbidities incluidng: arthritis, HTN are also affecting patient's functional outcome.   REHAB POTENTIAL: Good  CLINICAL DECISION MAKING: Stable/uncomplicated  EVALUATION COMPLEXITY: Low   GOALS: Goals reviewed with patient? Yes  SHORT TERM GOALS: Target date: 01/04/2023   Independent in initial HEP  Baseline:  Goal status: INITIAL  2.  Initiate postural correction and education  Baseline:  Goal status: INITIAL    LONG TERM GOALS: Target date: 02/01/2023  Improve posture and alignment with patient to demonstrate improved upright posture with posterior shoulder girdle engaged  Baseline:  Goal status: INITIAL  2.  Increase cervical ROM and ease of movement in rotation and lateral flexion  Baseline:  Goal status: INITIAL  3.  Decrease frequency and duration of Rt UE radicular symptoms by 80-100%  Baseline:  Goal status: INITIAL  4.  Patient demonstrates and reports ability to stand and sit with activation of posterior shoulder  girdle musculature - increase strength  Baseline:  Goal status: INITIAL  5.  Independent in HEP including aquatic program as indicated  Baseline:  Goal status: INITIAL  6.  Improve functional limitation score to 62 Baseline: 50 Goal status: INITIAL   PLAN:  PT FREQUENCY: 2x/week  PT DURATION: 8 weeks  PLANNED INTERVENTIONS: Therapeutic exercises, Therapeutic activity, Neuromuscular re-education, Patient/Family education, Self Care, Joint mobilization, Aquatic Therapy, Dry Needling, Electrical stimulation, Spinal mobilization, Cryotherapy, Moist heat, Taping, Traction, Ultrasound, Ionotophoresis '4mg'$ /ml Dexamethasone, Manual therapy, and Re-evaluation  PLAN FOR NEXT SESSION: review and progress exercises; continue postural correction and education; manual work including Dn as well as modalities as indicated    [employer], PT 12/07/2022, 5:00 PM

## 2022-12-08 ENCOUNTER — Ambulatory Visit: Payer: Commercial Managed Care - PPO

## 2022-12-09 ENCOUNTER — Ambulatory Visit: Payer: Commercial Managed Care - PPO | Admitting: Physical Therapy

## 2022-12-14 ENCOUNTER — Encounter: Payer: Self-pay | Admitting: Rehabilitative and Restorative Service Providers"

## 2022-12-14 ENCOUNTER — Ambulatory Visit: Payer: Commercial Managed Care - PPO | Admitting: Rehabilitative and Restorative Service Providers"

## 2022-12-14 DIAGNOSIS — M5412 Radiculopathy, cervical region: Secondary | ICD-10-CM | POA: Diagnosis not present

## 2022-12-14 DIAGNOSIS — R293 Abnormal posture: Secondary | ICD-10-CM

## 2022-12-14 DIAGNOSIS — R29898 Other symptoms and signs involving the musculoskeletal system: Secondary | ICD-10-CM

## 2022-12-14 NOTE — Therapy (Signed)
OUTPATIENT PHYSICAL THERAPY CERVICAL EVALUATION   Patient Name: Leslie Wiggins MRN: [id number] DOB:10/29/1971, 52 y.o., female Today's Date: 12/14/2022  END OF SESSION:  PT End of Session - 12/14/22 1357     Visit Number 2    Number of Visits 16    Date for PT Re-Evaluation 02/01/23    Authorization Type [facility] [employer]    PT Start Time 1357    PT Stop Time 6948    PT Time Calculation (min) 48 min    Activity Tolerance Patient tolerated treatment well             Past Medical History:  Diagnosis Date   Anxiety    Depression    Hay fever    Hypertension    Past Surgical History:  Procedure Laterality Date   [place] EXTRACTION     Patient Active Problem List   Diagnosis Date Noted   Pulmonary emphysema ([place]) 11/18/2022   Aortic atherosclerosis ([place]) 11/18/2022   Situational anxiety 08/12/2022   Shift work sleep disorder 08/12/2022   High risk medication use 08/12/2022   Fracture of ankle, lateral malleolus, right, closed 12/24/2021   Radiculitis of right cervical region 12/23/2021   Chronic pain of right ankle 12/23/2021   Dyshidrotic eczema 07/09/2021   Primary hypertension 06/22/2019   Bronchitis 11/01/2018   Primary insomnia 11/01/2018   Pain of left heel 04/13/2018   Leukemoid reaction 05/23/2017   Obesity 02/26/2016    PCP: Samuel Bouche, NP  REFERRING PROVIDER: Samuel Bouche, NP  REFERRING DIAG: Cervical radiculitis  THERAPY DIAG:  Radiculopathy, cervical region  Other symptoms and signs involving the musculoskeletal system  Abnormal posture  Rationale for Evaluation and Treatment: Rehabilitation  ONSET DATE: 12/06/2021  SUBJECTIVE:                                                                                                                                                                                                         SUBJECTIVE STATEMENT: Patient reports that she has had respiratory problems for the  past week. She went back to work last night and did OK. Patient reports she has numbness in Rt thumb, index and long fingers and sometimes whole hand continues on an intermittent basis. She can get improvement in symptoms with bow and arrow movement. Symptoms have been present for over one year with no known injury. She feels the symptoms are related to ergonomics when using a computer.   PERTINENT HISTORY:  Arthritis; LBP; ESI low back; HTN; insomnia; 1 year history  of Rt UE radiculopathy   PAIN:  Are you having pain? Yes: NPRS scale: 0/10; at times mid to upper back pain 5-6/10 Pain location: mid to upper back  Pain description:  aching; dull  Aggravating factors: upper to mid back  Relieving factors: movement; meds   PRECAUTIONS: None  WEIGHT BEARING RESTRICTIONS: No  FALLS:  Has patient fallen in last 6 months? 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  PATIENT SURVEYS:  FOTO 50  SENSATION: Numbness Rt thumb, index and long fingers at times whole hand - about 2-5 min of symptoms - relieved with 2-3 min of movement and exercises lasting for ~ 30 min -  may happen up to 10 times in every 12 hour work day   POSTURE: Patient presents with head forward posture with increased thoracic kyphosis; shoulders rounded and elevated; scapulae abducted and rotated along the thoracic spine; head of the humerus anterior in orientation.   PALPATION: Muscular tightness Rt > Lt pecs; cervical musculature; thoracic paraspinals    CERVICAL ROM:   Active ROM A/PROM (deg) eval  Flexion 62  Extension 54  Right lateral flexion 30   Left lateral flexion 33  Right rotation 64  Left rotation 63   (Blank rows = not tested)  UPPER EXTREMITY ROM:  Shoulder ROM - WNL's in all planes   UPPER EXTREMITY MMT:    WNL's bilat    CERVICAL SPECIAL TESTS:  Compression/distraction; thoracic outlet tests bilat (-)    TODAY'S TREATMENT:        [facility] Adult PT Treatment:                                                DATE: 12/14/22 Therapeutic Exercise: Doorway stretch, 3 positions 30 sec x 2  Chin tuck with noodle 10 sec x 10  Scap squeeze with noodle 10 sec x 10  L's with noodle 3 sec x 10  W's with noodle 3 sec x 10  Neural mobilization 1 min x 2 Rt ?Lt  Nerve glide bilat UE x ~ 1 min  Manual Therapy: Soft tissue mobilization and trigger point work pt supine working through [employer] > Lt ant/mid/posterior cervical musculature; upper traps; pecs  PROM and stretch through the Rt UE long arm  Neuromuscular re-ed: Working on posterior shoulder girdle control standing with noodle focus on control of posterior shoulder girdle with UE movement   Therapeutic Activity: Discussed importance of posture and alignment with as well as strengthening  Modalities: Moist heat thoracic spine during manual work  Self Care: Use of noodle for sitting at work and in car  [facility] Adult PT Treatment:                                                DATE: 12/07/22 Therapeutic Exercise: Doorway stretch, 3 positions 30 sec x 2  Chin tuck with noodle 10 sec x 10  Scap squeeze with noodle 10 sec x 10  Manual Therapy:  Neuromuscular re-ed: Working on posterior shoulder girdle control standing with noodle focus on control of posterior shoulder girdle with UE movement   Therapeutic Activity: Discussed importance of posture and alignment with as well as strengthening  Modalities:  Self Care: Use of noodle for sitting at work and in  car    PATIENT EDUCATION:  Education details: POC; HEP Person educated: Patient Education method: Consulting civil engineer, Media planner, Corporate treasurer cues, Verbal cues, and Handouts Education comprehension: verbalized understanding, returned demonstration, verbal cues required, tactile cues required, and needs further education  HOME EXERCISE PROGRAM: Access Code: [email or web address] URL: [email or web address] Date: 12/14/2022 Prepared by: Gillermo Murdoch  Program Notes Neural mobilizaton supine - tuck, tip and turn head to Lt  - rt UE 90 deg shoulder flexion, straighten arm pointing fingers to floor palm up - gentle no pain 1 min x 2 x 2x per day nerve mobilization - battiong balloon across chest slowly  ~ 1 min 2-3 reps   Exercises - Doorway Pec Stretch at 60 Degrees Abduction  - 3 x daily - 7 x weekly - 1 sets - 3 reps - Doorway Pec Stretch at 90 Degrees Abduction  - 3 x daily - 7 x weekly - 1 sets - 3 reps - 30 seconds  hold - Doorway Pec Stretch at 120 Degrees Abduction  - 3 x daily - 7 x weekly - 1 sets - 3 reps - 30 second hold  hold - Seated Cervical Retraction  - 3 x daily - 7 x weekly - 1 sets - 10 reps - Standing Scapular Retraction  - 3 x daily - 7 x weekly - 1 sets - 10 reps - 10 hold - Shoulder External Rotation and Scapular Retraction  - 3 x daily - 7 x weekly - 1 sets - 10 reps -   hold ASSESSMENT:  CLINICAL IMPRESSION: No significant change in symptoms with patient being unabel to exercise consistently due to feeling bad with respiratory illness.   OBJECTIVE IMPAIRMENTS: decreased activity tolerance, decreased endurance, decreased mobility, decreased ROM, increased fascial restrictions, increased muscle spasms, impaired flexibility, impaired sensation, impaired UE functional use, improper body mechanics, postural dysfunction, and pain.   GOALS: Goals reviewed with patient? Yes  SHORT TERM GOALS: Target date: 01/04/2023   Independent in initial HEP  Baseline:  Goal status:  INITIAL  2.  Initiate postural correction and education  Baseline:  Goal status: INITIAL    LONG TERM GOALS: Target date: 02/01/2023  Improve posture and alignment with patient to demonstrate improved upright posture with posterior shoulder girdle engaged  Baseline:  Goal status: INITIAL  2.  Increase cervical ROM and ease of movement in rotation and lateral flexion  Baseline:  Goal status: INITIAL  3.  Decrease frequency and duration of Rt UE radicular symptoms by 80-100%  Baseline:  Goal status: INITIAL  4.  Patient demonstrates and reports ability to stand and sit with activation of posterior shoulder girdle musculature - increase strength  Baseline:  Goal status: INITIAL  5.  Independent in HEP including aquatic program as indicated  Baseline:  Goal status: INITIAL  6.  Improve functional limitation score to 62 Baseline: 50 Goal status: INITIAL   PLAN:  PT FREQUENCY: 2x/week  PT DURATION: 8 weeks  PLANNED INTERVENTIONS: Therapeutic exercises, Therapeutic activity, Neuromuscular re-education, Patient/Family education, Self Care, Joint mobilization, Aquatic Therapy, Dry Needling, Electrical stimulation, Spinal mobilization, Cryotherapy, Moist heat, Taping, Traction, Ultrasound, Ionotophoresis '4mg'$ /ml Dexamethasone, Manual therapy, and Re-evaluation  PLAN FOR NEXT SESSION: review and progress exercises; continue postural correction and education; manual work and modalities as indicated (no DN at pt request)    Everardo All, PT 12/14/2022, 1:59 PM

## 2022-12-16 ENCOUNTER — Other Ambulatory Visit (HOSPITAL_COMMUNITY): Payer: Self-pay

## 2022-12-16 ENCOUNTER — Ambulatory Visit (AMBULATORY_SURGERY_CENTER): Payer: Commercial Managed Care - PPO

## 2022-12-16 VITALS — Ht 66.0 in | Wt 241.0 lb

## 2022-12-16 DIAGNOSIS — Z1211 Encounter for screening for malignant neoplasm of colon: Secondary | ICD-10-CM

## 2022-12-16 MED ORDER — NA SULFATE-K SULFATE-MG SULF 17.5-3.13-1.6 GM/177ML PO SOLN
1.0000 | Freq: Once | ORAL | 0 refills | Status: AC
  Filled 2022-12-16: qty 354, 1d supply, fill #0

## 2022-12-16 NOTE — Progress Notes (Signed)

## 2022-12-20 ENCOUNTER — Other Ambulatory Visit (HOSPITAL_COMMUNITY): Payer: Self-pay

## 2022-12-21 ENCOUNTER — Ambulatory Visit: Payer: Commercial Managed Care - PPO | Admitting: Rehabilitative and Restorative Service Providers"

## 2022-12-21 ENCOUNTER — Encounter: Payer: Self-pay | Admitting: Rehabilitative and Restorative Service Providers"

## 2022-12-21 DIAGNOSIS — R29898 Other symptoms and signs involving the musculoskeletal system: Secondary | ICD-10-CM

## 2022-12-21 DIAGNOSIS — R293 Abnormal posture: Secondary | ICD-10-CM | POA: Diagnosis not present

## 2022-12-21 DIAGNOSIS — M5412 Radiculopathy, cervical region: Secondary | ICD-10-CM

## 2022-12-21 NOTE — Therapy (Signed)
OUTPATIENT PHYSICAL THERAPY CERVICAL EVALUATION   Patient Name: Leslie Wiggins MRN: [id number] DOB:16-Jun-1971, 52 y.o., female Today's Date: 12/21/2022  END OF SESSION:  PT End of Session - 12/21/22 1318     Visit Number 3    Number of Visits 16    Date for PT Re-Evaluation 02/01/23    Authorization Type [facility] [employer]    PT Start Time 1315    PT Stop Time 1403    PT Time Calculation (min) 48 min    Activity Tolerance Patient tolerated treatment well             Past Medical History:  Diagnosis Date   Anxiety    Depression    Emphysema of lung ([place])    Hay fever    Hypertension    Psoriasis    Past Surgical History:  Procedure Laterality Date   WISDOM TOOTH EXTRACTION  1988   WISDOM TOOTH EXTRACTION     Patient Active Problem List   Diagnosis Date Noted   Pulmonary emphysema ([place]) 11/18/2022   Aortic atherosclerosis ([place]) 11/18/2022   Situational anxiety 08/12/2022   Shift work sleep disorder 08/12/2022   High risk medication use 08/12/2022   Fracture of ankle, lateral malleolus, right, closed 12/24/2021   Radiculitis of right cervical region 12/23/2021   Chronic pain of right ankle 12/23/2021   Dyshidrotic eczema 07/09/2021   Primary hypertension 06/22/2019   Bronchitis 11/01/2018   Primary insomnia 11/01/2018   Pain of left heel 04/13/2018   Leukemoid reaction 05/23/2017   Obesity 02/26/2016    PCP: Samuel Bouche, NP  REFERRING PROVIDER: Samuel Bouche, NP  REFERRING DIAG: Cervical radiculitis  THERAPY DIAG:  Radiculopathy, cervical region  Other symptoms and signs involving the musculoskeletal system  Abnormal posture  Rationale for Evaluation and Treatment: Rehabilitation  ONSET DATE: 12/06/2021  SUBJECTIVE:                                                                                                                                                                                                         SUBJECTIVE STATEMENT: Patient reports that  her Rt UE is fallings asleep less frequently. respiratory problems for the past week. She has numbness in Rt thumb, index and long fingers and sometimes whole hand continues on an intermittent basis. She can get improvement in symptoms with bow and arrow movement.   PERTINENT HISTORY:  Arthritis; LBP; ESI low back; HTN; insomnia; 1 year history of Rt UE radiculopathy   PAIN:  Are you having pain? Yes: NPRS scale: 0/10; no pain in the  mid to upper back Pain location: mid to upper back  Pain description:  aching; dull  Aggravating factors: upper to mid back  Relieving factors: movement; meds   PRECAUTIONS: None  WEIGHT BEARING RESTRICTIONS: No  FALLS:  Has patient fallen in last 6 months? 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  PATIENT SURVEYS:  FOTO 50  SENSATION: Numbness Rt thumb, index and long fingers at times whole hand - about 2-5 min of symptoms - relieved with 2-3 min of movement and exercises lasting for ~ 30 min -  may happen up to 10 times in every 12 hour work day   POSTURE: Patient presents with head forward posture with increased thoracic kyphosis; shoulders rounded and elevated; scapulae abducted and rotated along the thoracic spine; head of the humerus anterior in orientation.   PALPATION: Muscular tightness Rt > Lt pecs; cervical musculature; thoracic paraspinals    CERVICAL ROM:   Active ROM A/PROM (deg) eval  Flexion 62  Extension 54  Right lateral flexion 30  Left lateral flexion 33  Right rotation 64  Left rotation 63   (Blank rows = not tested)  UPPER EXTREMITY ROM:  Shoulder ROM -  WNL's in all planes   UPPER EXTREMITY MMT:    WNL's bilat    CERVICAL SPECIAL TESTS:  Compression/distraction; thoracic outlet tests bilat (-)    TODAY'S TREATMENT:        [facility] Adult PTTreatment:                                                 Treatment:                                                DATE: 12/21/22 Therapeutic Exercise: Doorway stretch, 3 positions 30 sec x 2  T stretch at wall 30 sec x 2 each side  Chin tuck with noodle 10 sec x 10  Scap squeeze with noodle 10 sec x 10  L's with noodle red TB 3 sec x 10  W's with noodle red TB 3 sec x 10  Rowing blue TB x 20  Neural mobilization 1 min x 2 Rt ?Lt  Nerve glide bilat UE x ~ 1 min  Neuromuscular re-ed: Working on posterior shoulder girdle control standing with noodle focus on control of posterior shoulder girdle with UE movement   Sitting working on posterior shoulder girdle activation - ball between hands moving ball chest to thighs; chest pushing forward; chest level slight extension with turning hands in supination and pronation; sitting wood chop shoulder to opposite thigh and reverse ~ 10-20 reps each with rest as needed  Therapeutic Activity: Continued education re- posture and alignment with as well as strengthening         DATE: 12/14/22 Therapeutic Exercise: Doorway stretch, 3 positions 30 sec x 2  Chin tuck with  noodle 10 sec x 10  Scap squeeze with noodle 10 sec x 10  L's with noodle 3 sec x 10  W's with noodle 3 sec x 10  Neural mobilization 1 min x 2 Rt ?Lt  Nerve glide bilat UE x ~ 1 min  Manual Therapy: Soft tissue mobilization and trigger point work pt supine working through [employer] > Lt ant/mid/posterior cervical musculature; upper traps; pecs  PROM and stretch through the Rt UE long arm  Neuromuscular re-ed: Working on posterior shoulder girdle control standing with noodle focus on control of posterior shoulder girdle with UE movement   Therapeutic Activity: Discussed importance of posture and  alignment with as well as strengthening  Modalities: Moist heat thoracic spine during manual work  Self Care: Use of noodle for sitting at work and in car                                                                                                                PATIENT EDUCATION:  POC; HEP; postural correction; ADL's with neuromuscular re-ed   HOME EXERCISE PROGRAM: Access Code: [email or web address] URL: [email or web address] Date: 12/21/2022 Prepared by: Gillermo Murdoch  Program Notes Neural mobilization supine - tuck, tip and turn head to Lt  - rt UE 90 deg shoulder flexion, straighten arm pointing fingers to floor palm up - gentle no pain 1 min x 2 x 2x per day nerve mobilization - batting balloon across chest slowly  ~ 1 min 2-3 reps 12/21/22: Neuromuscular re-education Holding ball between hands; -move ball chest to lap and back -move ball forward from chest pushing out  -hold ball at chest height and turn ball like steering wheel -wood chopping one shoulder to opposite knee then reverse   Exercises - Doorway Pec Stretch at 60 Degrees Abduction  - 3 x daily - 7 x weekly - 1 sets - 3 reps - Doorway Pec Stretch at 90 Degrees Abduction  - 3 x daily - 7 x weekly - 1 sets - 3 reps - 30 seconds  hold - Doorway Pec Stretch at 120 Degrees Abduction  - 3 x daily - 7 x weekly - 1 sets - 3 reps - 30 second hold  hold - Seated Cervical Retraction  - 3 x daily - 7 x weekly - 1 sets - 10 reps - Standing Scapular Retraction  - 3 x daily - 7 x weekly - 1 sets - 10 reps - 10 hold - Shoulder External Rotation and Scapular Retraction  - 3 x daily - 7 x weekly - 1 sets - 10 reps -   hold - Shoulder External Rotation and Scapular Retraction with Resistance  - 2 x daily - 7 x weekly - 3 sets - 10 reps - Shoulder W - External Rotation with Resistance  - 2 x daily - 7 x weekly - 1-2 sets - 10 reps - 3 sec  hold - Scapular Retraction with Resistance  - 2 x daily - 7 x weekly - 3 sets - 10  reps  ASSESSMENT:  CLINICAL IMPRESSION: Improving with decreased frequency, intensity and duration of Rt UE radicular symptoms. Patient can now lie on either side for a short period of time. She can relieve symptoms when they start. Progressing well with exercises and postural correction. Added strengthening exercises with theraband and neuromuscuar re-ed with ball. Between hands.   OBJECTIVE IMPAIRMENTS: decreased activity tolerance, decreased endurance, decreased mobility, decreased ROM, increased fascial restrictions, increased muscle spasms, impaired flexibility, impaired sensation, impaired UE functional use, improper body mechanics, postural dysfunction, and pain.   GOALS: Goals reviewed with patient? Yes  SHORT TERM GOALS: Target date: 01/04/2023   Independent in initial HEP  Baseline:  Goal status: INITIAL  2.  Initiate postural correction and education  Baseline:  Goal status: INITIAL    LONG TERM GOALS: Target date: 02/01/2023  Improve posture and alignment with patient to demonstrate improved upright posture with posterior shoulder girdle engaged  Baseline:  Goal status: INITIAL  2.  Increase cervical ROM and ease of movement in rotation and lateral flexion  Baseline:  Goal status: INITIAL  3.  Decrease frequency and duration of Rt UE radicular symptoms by 80-100%  Baseline:  Goal status: INITIAL  4.  Patient demonstrates and reports ability to stand and sit with activation of posterior shoulder girdle musculature - increase strength  Baseline:  Goal status: INITIAL  5.  Independent in HEP including aquatic program as indicated  Baseline:  Goal status: INITIAL  6.  Improve functional limitation score to 62 Baseline: 50 Goal status: INITIAL   PLAN:  PT FREQUENCY: 2x/week  PT DURATION: 8 weeks  PLANNED INTERVENTIONS: Therapeutic exercises, Therapeutic activity, Neuromuscular re-education, Patient/Family education, Self Care, Joint mobilization, Aquatic  Therapy, Dry Needling, Electrical stimulation, Spinal mobilization, Cryotherapy, Moist heat, Taping, Traction, Ultrasound, Ionotophoresis '4mg'$ /ml Dexamethasone, Manual therapy, and Re-evaluation  PLAN FOR NEXT SESSION: review and progress exercises; continue postural correction and education; manual work and modalities as indicated (no DN at pt request)    Everardo All, PT 12/21/2022, 1:19 PM

## 2022-12-28 ENCOUNTER — Ambulatory Visit: Payer: Commercial Managed Care - PPO | Admitting: Rehabilitative and Restorative Service Providers"

## 2022-12-28 ENCOUNTER — Encounter: Payer: Self-pay | Admitting: Rehabilitative and Restorative Service Providers"

## 2022-12-28 DIAGNOSIS — R293 Abnormal posture: Secondary | ICD-10-CM

## 2022-12-28 DIAGNOSIS — R29898 Other symptoms and signs involving the musculoskeletal system: Secondary | ICD-10-CM | POA: Diagnosis not present

## 2022-12-28 DIAGNOSIS — M5412 Radiculopathy, cervical region: Secondary | ICD-10-CM

## 2022-12-28 NOTE — Therapy (Signed)
OUTPATIENT PHYSICAL THERAPY CERVICAL EVALUATION   Patient Name: Leslie Wiggins MRN: [id number] DOB:08-20-1971, 52 y.o., female Today's Date: 12/28/2022  END OF SESSION:  PT End of Session - 12/28/22 1325     Visit Number 4    Number of Visits 16    Date for PT Re-Evaluation 02/01/23    Authorization Type [facility]    PT Start Time 1322    PT Stop Time 1400    PT Time Calculation (min) 38 min    Activity Tolerance Patient tolerated treatment well             Past Medical History:  Diagnosis Date   Anxiety    Depression    Emphysema of lung ([place])    Hay fever    Hypertension    Psoriasis    Past Surgical History:  Procedure Laterality Date   WISDOM TOOTH EXTRACTION  1988   WISDOM TOOTH EXTRACTION     Patient Active Problem List   Diagnosis Date Noted   Pulmonary emphysema ([place]) 11/18/2022   Aortic atherosclerosis ([place]) 11/18/2022   Situational anxiety 08/12/2022   Shift work sleep disorder 08/12/2022   High risk medication use 08/12/2022   Fracture of ankle, lateral malleolus, right, closed 12/24/2021   Radiculitis of right cervical region 12/23/2021   Chronic pain of right ankle 12/23/2021   Dyshidrotic eczema 07/09/2021   Primary hypertension 06/22/2019   Bronchitis 11/01/2018   Primary insomnia 11/01/2018   Pain of left heel 04/13/2018   Leukemoid reaction 05/23/2017   Obesity 02/26/2016    PCP: Samuel Bouche, NP  REFERRING PROVIDER: Samuel Bouche, NP  REFERRING DIAG: Cervical radiculitis  THERAPY DIAG:  Radiculopathy, cervical region  Other symptoms and signs involving the musculoskeletal system  Abnormal posture  Rationale for Evaluation and Treatment: Rehabilitation  ONSET DATE: 12/06/2021  SUBJECTIVE:                                                                                                                                                                                                         SUBJECTIVE STATEMENT: Patient reports that  her Rt UE is fallings asleep less frequently, now only once during her work shift and she can relieve symptoms in a fairly short period of time. She was having numbness 10 times a shift. Still has some trouble with numbness when driving. Will try using noodle in the car.   Numbness in Rt thumb, index and long fingers and no longer having symptoms whole hand continues on an intermittent basis. She can get improvement in symptoms with  stretching and with bow and arrow movement.   PERTINENT HISTORY:  Arthritis; LBP; ESI low back; HTN; insomnia; 1 year history of Rt UE radiculopathy   PAIN:  Are you having pain? Yes: NPRS scale: 0/10; no pain in the mid to upper back Pain location: mid to upper back  Pain description:  aching; dull  Aggravating factors: upper to mid back  Relieving factors: movement; meds   PRECAUTIONS: None  WEIGHT BEARING RESTRICTIONS: No  FALLS:  Has patient fallen in last 6 months? 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  PATIENT SURVEYS:  FOTO 50  SENSATION: Numbness Rt thumb, index and long fingers at times whole hand - about 2-5 min of symptoms - relieved with 2-3 min of movement and exercises lasting for ~ 30 min -  may happen up to 10 times in every 12 hour work day   POSTURE: Patient presents with head forward posture with increased thoracic kyphosis; shoulders rounded and elevated; scapulae abducted and rotated along the thoracic spine; head of the humerus anterior in orientation.   PALPATION: Muscular tightness Rt > Lt pecs; cervical musculature; thoracic paraspinals    CERVICAL ROM:   Active  ROM A/PROM (deg) eval  Flexion 62  Extension 54  Right lateral flexion 30  Left lateral flexion 33  Right rotation 64  Left rotation 63   (Blank rows = not tested)  UPPER EXTREMITY ROM:  Shoulder ROM - WNL's in all planes   UPPER EXTREMITY MMT:    WNL's bilat    CERVICAL SPECIAL TESTS:  Compression/distraction; thoracic outlet tests bilat (-)    TODAY'S TREATMENT:        [facility] Adult PTTreatment:                                                 Treatment:                                                DATE: 12/28/22 Therapeutic Exercise: Doorway stretch, 3 positions 30 sec x 2  T stretch at wall 30 sec x 2 each side  T elbow 90 at 90 deg 30 sec x 2  Chin tuck with noodle 10 sec x 10  Scap squeeze with noodle 10 sec x 10  L's with noodle red TB 3 sec x 10  W's with noodle red TB 3 sec x 10  Rowing blue TB x 20  Neural mobilization 1 min x 2 Rt ?Lt  Nerve glide bilat UE x ~ 1 min  Neuromuscular re-ed: Working on posterior shoulder girdle control standing with noodle focus on control of posterior shoulder girdle with UE movement   Sitting working on posterior shoulder girdle activation - ball between hands moving ball chest to thighs; chest pushing forward; chest level slight extension with turning hands in supination and pronation; sitting wood  chop shoulder to opposite thigh and reverse ~ 10-20 reps each with rest as needed  Therapeutic Activity: Continued education re- posture and alignment with as well as strengthening         Treatment:                                                DATE: 12/21/22 Therapeutic Exercise: Doorway stretch, 3 positions 30 sec x 2  T stretch at wall 30 sec x 2 each side  Chin tuck with noodle 10 sec x 10  Scap squeeze with noodle 10 sec x 10  L's with noodle red TB 3 sec x 10  W's with noodle red TB 3 sec x 10  Rowing blue TB x 20  Neural mobilization 1 min x 2 Rt ?Lt  Nerve glide bilat UE x ~ 1 min  Row isometric step back blue TB x 5  10-20 sec  Neuromuscular re-ed: Working on posterior shoulder girdle control standing with noodle focus on control of posterior shoulder girdle with UE movement   Sitting working on posterior shoulder girdle activation - using stick in Rt UE to work on positions of UE's in more functional posture and alignment. Holding position to work on static postural control.  Therapeutic Activity: Continued education re- posture and alignment with as well as strengthening    PATIENT EDUCATION:  POC; HEP; postural correction; ADL's with neuromuscular re-ed   HOME EXERCISE PROGRAM: Access Code: [email or web address] URL: [email or web address] Date: 12/28/2022 Prepared by: Gillermo Murdoch  Program Notes Neural mobilization supine - tuck, tip and turn head to Lt  - rt UE 90 deg shoulder flexion, straighten arm pointing fingers to floor palm up - gentle no pain 1 min x 2 x 2x per day nerve mobilization - batting balloon across chest slowly  ~ 1 min 2-3 reps 12/21/22: Neuromuscular re-education Holding ball between hands; -move ball chest to lap and back -move ball forward from chest pushing out  -hold ball at chest height and turn ball like steering wheel -wood chopping one shoulder to opposite knee then reverse 12/28/22: Neuromuscular re-education - wooden dowel Rt hand   Exercises - Doorway Pec Stretch at 60 Degrees Abduction  - 3 x daily - 7 x weekly - 1 sets - 3 reps - Doorway Pec Stretch at 90 Degrees Abduction  - 3 x daily - 7 x weekly - 1 sets - 3 reps - 30 seconds  hold - Doorway Pec Stretch at 120 Degrees Abduction  - 3 x daily - 7 x weekly - 1 sets - 3 reps - 30 second hold  hold - Seated Cervical Retraction  - 3 x daily - 7 x weekly - 1 sets - 10 reps - Standing Scapular Retraction  - 3 x daily - 7 x weekly - 1 sets - 10 reps - 10 hold - Shoulder External Rotation and Scapular Retraction  - 3 x daily - 7 x weekly - 1 sets - 10 reps -   hold - Shoulder External Rotation and Scapular Retraction with  Resistance  - 2 x daily - 7 x weekly - 3 sets - 10 reps - Shoulder W - External Rotation with Resistance  - 2 x daily - 7 x weekly - 1-2 sets - 10 reps - 3 sec  hold - Scapular Retraction with Resistance  - 2  x daily - 7 x weekly - 3 sets - 10 reps - Seated Sidebending  - 2 x daily - 7 x weekly - 1 sets - 3 reps - 30 sec  hold - Standing Plank on Wall  - 2 x daily - 7 x weekly - 1 sets - 3 reps - 30 sec  hold - Standing Shoulder Row Reactive Isometric  - 2 x daily - 7 x weekly - 1 sets - 10 reps - 30-45 sec  hold  ASSESSMENT:  CLINICAL IMPRESSION: Improving with decreased frequency, intensity and duration of Rt UE radicular symptoms. Patient can now lie on either side for a short period of time. She can relieve symptoms when they start. Progressing well with exercises and postural correction. Continued strengthening exercises with theraband, ball and added use of wooden dowel for neuromuscuar re-ed.   OBJECTIVE IMPAIRMENTS: decreased activity tolerance, decreased endurance, decreased mobility, decreased ROM, increased fascial restrictions, increased muscle spasms, impaired flexibility, impaired sensation, impaired UE functional use, improper body mechanics, postural dysfunction, and pain.   GOALS: Goals reviewed with patient? Yes  SHORT TERM GOALS: Target date: 01/04/2023   Independent in initial HEP  Baseline:  Goal status: INITIAL  2.  Initiate postural correction and education  Baseline:  Goal status: INITIAL    LONG TERM GOALS: Target date: 02/01/2023  Improve posture and alignment with patient to demonstrate improved upright posture with posterior shoulder girdle engaged  Baseline:  Goal status: INITIAL  2.  Increase cervical ROM and ease of movement in rotation and lateral flexion  Baseline:  Goal status: INITIAL  3.  Decrease frequency and duration of Rt UE radicular symptoms by 80-100%  Baseline:  Goal status: INITIAL  4.  Patient demonstrates and reports ability to  stand and sit with activation of posterior shoulder girdle musculature - increase strength  Baseline:  Goal status: INITIAL  5.  Independent in HEP including aquatic program as indicated  Baseline:  Goal status: INITIAL  6.  Improve functional limitation score to 62 Baseline: 50 Goal status: INITIAL   PLAN:  PT FREQUENCY: 2x/week  PT DURATION: 8 weeks  PLANNED INTERVENTIONS: Therapeutic exercises, Therapeutic activity, Neuromuscular re-education, Patient/Family education, Self Care, Joint mobilization, Aquatic Therapy, Dry Needling, Electrical stimulation, Spinal mobilization, Cryotherapy, Moist heat, Taping, Traction, Ultrasound, Ionotophoresis '4mg'$ /ml Dexamethasone, Manual therapy, and Re-evaluation  PLAN FOR NEXT SESSION: review and progress exercises; continue postural correction and education; manual work and modalities as indicated (no DN at pt request)    Everardo All, PT 12/28/2022, 1:26 PM

## 2023-01-03 ENCOUNTER — Encounter: Payer: Self-pay | Admitting: Gastroenterology

## 2023-01-04 ENCOUNTER — Ambulatory Visit: Payer: Commercial Managed Care - PPO | Admitting: Rehabilitative and Restorative Service Providers"

## 2023-01-04 ENCOUNTER — Encounter: Payer: Self-pay | Admitting: Rehabilitative and Restorative Service Providers"

## 2023-01-04 DIAGNOSIS — M5412 Radiculopathy, cervical region: Secondary | ICD-10-CM

## 2023-01-04 DIAGNOSIS — R293 Abnormal posture: Secondary | ICD-10-CM

## 2023-01-04 DIAGNOSIS — R29898 Other symptoms and signs involving the musculoskeletal system: Secondary | ICD-10-CM | POA: Diagnosis not present

## 2023-01-04 NOTE — Therapy (Addendum)
OUTPATIENT PHYSICAL THERAPY CERVICAL EVALUATION AND DISCHARGE SUMMARY   PHYSICAL THERAPY DISCHARGE SUMMARY  Visits from Start of Care: 5  Current functional level related to goals / functional outcomes: See progress note for discharge status    Remaining deficits: Unknown     Education / Equipment: HEP    Patient agrees to discharge. Patient goals were partially met. Patient is being discharged due to not returning since the last visit. Thamara Leger P. Helene Kelp PT, MPH 03/02/23 2:39 PM  Patient Name: Leslie Wiggins MRN: [id number] DOB:03/14/1971, 52 y.o., female Today's Date: 01/04/2023  END OF SESSION:  PT End of Session - 01/04/23 1326     Visit Number 5    Number of Visits 16    Date for PT Re-Evaluation 02/01/23    Authorization Type [facility]    PT Start Time 1321    PT Stop Time 1400    PT Time Calculation (min) 39 min    Activity Tolerance Patient tolerated treatment well             Past Medical History:  Diagnosis Date   Anxiety    Depression    Emphysema of lung ([place])    Hay fever    Hypertension    Psoriasis    Past Surgical History:  Procedure Laterality Date   [place] EXTRACTION     Patient Active Problem List   Diagnosis Date Noted   Pulmonary emphysema ([place]) 11/18/2022   Aortic atherosclerosis ([place]) 11/18/2022   Situational anxiety 08/12/2022   Shift work sleep disorder 08/12/2022   High risk medication use 08/12/2022   Fracture of ankle, lateral malleolus, right, closed 12/24/2021   Radiculitis of right cervical region 12/23/2021   Chronic pain of right ankle 12/23/2021   Dyshidrotic eczema 07/09/2021   Primary hypertension 06/22/2019   Bronchitis 11/01/2018   Primary insomnia 11/01/2018   Pain of left heel 04/13/2018   Leukemoid reaction 05/23/2017   Obesity 02/26/2016    PCP: Samuel Bouche, NP  REFERRING PROVIDER: Samuel Bouche, NP  REFERRING DIAG: Cervical radiculitis  THERAPY DIAG:   Radiculopathy, cervical region  Other symptoms and signs involving the musculoskeletal system  Abnormal posture  Rationale for Evaluation and Treatment: Rehabilitation  ONSET DATE: 12/06/2021  SUBJECTIVE:                                                                                                                                                                                                         SUBJECTIVE STATEMENT: Patient reports that she fell  asleep for an undetrtimed amout of time and awoke with numbness in the Rt hand which lasted for ~ 1 hour. The rest of the day the hand was more irritated. But has returned to baseline and now patient reports that her Rt UE again is falling asleep less frequently, now only once during her work shift and she can relieve symptoms in a fairly short period of time. She was having numbness 10 times a shift. Still has some trouble with numbness when driving. Will try using noodle in the car.   Numbness in Rt thumb, index and long fingers and no longer having symptoms whole hand continues on an intermittent basis. She can get improvement in symptoms with stretching and with bow and arrow movement.  She found a large noodle at [facility] and has been using the noodle to release the tightness in the anterior chest   PERTINENT HISTORY:  Arthritis; LBP; ESI low back; HTN; insomnia; 1 year history of Rt UE radiculopathy   PAIN:  Are you having pain? Yes: NPRS scale: 0/10; no pain in the mid to upper back Pain location: mid to upper back  Pain description:  aching; dull  Aggravating factors: upper to mid back  Relieving factors: movement; meds   PRECAUTIONS: None  WEIGHT BEARING RESTRICTIONS: No  FALLS:  Has patient fallen in last 6 months? 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  PATIENT SURVEYS:  FOTO 50  01/04/23 - 68 (goal - 65)  SENSATION: Numbness Rt thumb, index and long fingers at times whole hand - about 2-5 min of symptoms - relieved with 2-3 min of movement and exercises lasting for ~ 30 min -  may happen up to 10 times in every 12 hour work day   POSTURE: Patient presents with head forward posture with increased thoracic kyphosis; shoulders rounded and elevated; scapulae abducted and rotated along the thoracic spine; head of the humerus anterior in orientation.   PALPATION: Muscular tightness Rt > Lt pecs; cervical musculature; thoracic paraspinals    CERVICAL ROM:   Active ROM A/PROM (deg) eval  Flexion 62  Extension 54  Right lateral flexion 30  Left lateral flexion 33  Right rotation 64  Left rotation 63   (Blank rows = not tested)  UPPER EXTREMITY ROM:  Shoulder ROM - WNL's in all planes   UPPER EXTREMITY MMT:    WNL's bilat    CERVICAL SPECIAL TESTS:  Compression/distraction; thoracic outlet tests bilat (-)    TODAY'S TREATMENT:        [facility] Adult PTTreatment:                                                 Treatment:                                                DATE: 12/28/22  Therapeutic Exercise: Doorway stretch, 3 positions 30 sec x 2  T stretch at wall 30 sec x 2 each side  T elbow 90 at 90 deg 30 sec x 2  Chin tuck with noodle 10 sec x 10  Scap squeeze with noodle 10 sec x 10  L's with noodle red TB 3 sec x 10  W's with noodle red TB 3 sec x 10  Rowing blue TB x 20  Neural mobilization 1 min x 2 Rt ?Lt  Nerve glide bilat UE x ~ 1 min  Neuromuscular re-ed: Working on posterior shoulder girdle control standing with noodle focus on control of posterior shoulder girdle with UE movement   Sitting working on posterior  shoulder girdle activation - ball between hands moving ball chest to thighs; chest pushing forward; chest level slight extension with turning hands in supination and pronation; sitting wood chop shoulder to opposite thigh and reverse ~ 10-20 reps each with rest as needed  Therapeutic Activity: Supine on large noodle UE's out to side in various angles; repeated with coregeous ball at thoracic spine; trunk rotation in supine with coregeous ball; lying on stomach with ball at abdomen - each position for 2-3 minutes. Supine on 4 inch noodle with noodle along spine and then across spine to improve thoracic mobility 2-3 min  Continued education re- posture and alignment with as well as strengthening  Diaphragmatic breathing to count of 6 inhale; hold 6; exhale 6 counts x ~ 2-3 min     Treatment:                                                DATE: 12/28/22 Therapeutic Exercise: Doorway stretch, 3 positions 30 sec x 2  T stretch at wall 30 sec x 2 each side  T elbow 90 at 90 deg 30 sec x 2  Chin tuck with noodle 10 sec x 10  Scap squeeze with noodle 10 sec x 10  L's with noodle red TB 3 sec x 10  W's with noodle red TB 3 sec x 10  Rowing blue TB x 20  Neural mobilization 1 min x 2 Rt ?Lt  Nerve glide bilat UE x ~ 1 min  Neuromuscular re-ed: Working on posterior shoulder girdle control standing with noodle focus on control of posterior shoulder girdle with UE movement   Sitting working on posterior shoulder girdle activation - ball between hands moving ball chest to thighs; chest pushing forward; chest level slight extension with turning hands in supination and pronation; sitting wood chop shoulder to opposite thigh and reverse ~ 10-20 reps each with rest as needed  Therapeutic Activity: Continued education re- posture and alignment with as well as strengthening         Treatment:                                                DATE: 12/21/22 Therapeutic Exercise: Doorway stretch, 3 positions 30  sec x 2  T stretch at wall 30 sec x 2 each side  Chin tuck with noodle 10 sec x 10  Scap squeeze with noodle 10 sec x 10  L's with noodle red TB 3 sec x 10  W's with noodle  red TB 3 sec x 10  Rowing blue TB x 20  Neural mobilization 1 min x 2 Rt ?Lt  Nerve glide bilat UE x ~ 1 min  Row isometric step back blue TB x 5 10-20 sec  Neuromuscular re-ed: Working on posterior shoulder girdle control standing with noodle focus on control of posterior shoulder girdle with UE movement   Sitting working on posterior shoulder girdle activation - using stick in Rt UE to work on positions of UE's in more functional posture and alignment. Holding position to work on static postural control.  Therapeutic Activity: Continued education re- posture and alignment with as well as strengthening    PATIENT EDUCATION:  POC; HEP; postural correction; ADL's with neuromuscular re-ed   HOME EXERCISE PROGRAM: Access Code: [email or web address] URL: [email or web address] Date: 01/04/2023 Prepared by: Gillermo Murdoch  Program Notes Neural mobilization supine - tuck, tip and turn head to Lt  - rt UE 90 deg shoulder flexion, straighten arm pointing fingers to floor palm up - gentle no pain 1 min x 2 x 2x per day nerve mobilization - batting balloon across chest slowly  ~ 1 min 2-3 reps 12/21/22: Neuromuscular re-education Holding ball between hands; -move ball chest to lap and back -move ball forward from chest pushing out  -hold ball at chest height and turn ball like steering wheel -wood chopping one shoulder to opposite knee then reverse 12/28/22: Neuromuscular re-education - wooden dowel Rt hand   Exercises - Doorway Pec Stretch at 60 Degrees Abduction  - 3 x daily - 7 x weekly - 1 sets - 3 reps - Doorway Pec Stretch at 90 Degrees Abduction  - 3 x daily - 7 x weekly - 1 sets - 3 reps - 30 seconds  hold - Doorway Pec Stretch at 120 Degrees Abduction  - 3 x daily - 7 x weekly - 1 sets - 3 reps - 30 second hold  hold -  Seated Cervical Retraction  - 3 x daily - 7 x weekly - 1 sets - 10 reps - Standing Scapular Retraction  - 3 x daily - 7 x weekly - 1 sets - 10 reps - 10 hold - Shoulder External Rotation and Scapular Retraction  - 3 x daily - 7 x weekly - 1 sets - 10 reps -   hold - Shoulder External Rotation and Scapular Retraction with Resistance  - 2 x daily - 7 x weekly - 3 sets - 10 reps - Shoulder W - External Rotation with Resistance  - 2 x daily - 7 x weekly - 1-2 sets - 10 reps - 3 sec  hold - Scapular Retraction with Resistance  - 2 x daily - 7 x weekly - 3 sets - 10 reps - Seated Sidebending  - 2 x daily - 7 x weekly - 1 sets - 3 reps - 30 sec  hold - Standing Plank on Wall  - 2 x daily - 7 x weekly - 1 sets - 3 reps - 30 sec  hold - Standing Shoulder Row Reactive Isometric  - 2 x daily - 7 x weekly - 1 sets - 10 reps - 30-45 sec  hold - Supine Diaphragmatic Breathing  - 2 x daily - 7 x weekly - 1 sets - 10 reps - 4-6 sec  hold - Supine Chest Stretch on Foam Roll  - 2 x daily - 7 x weekly - 1 sets - 1 reps - 2-5 min  sec  hold - Thoracic  Foam Roll Mobilization Backstroke  - 1 x daily - 7 x weekly - 1 sets - 10 reps - 5 sec  hold - Thoracic Extension Mobilization on Foam Roll  - 1 x daily - 7 x weekly - 2-3 min hold  ASSESSMENT:  CLINICAL IMPRESSION: Excellent progress with continued improvement with decreased frequency, intensity and duration of Rt UE radicular symptoms. Patient can now lie on either side for a short period of time. She can relieve symptoms when they start. Progressing well with exercises and postural correction. Continued strengthening exercises with theraband, ball and added use of wooden dowel for neuromuscuar re-ed.   OBJECTIVE IMPAIRMENTS: decreased activity tolerance, decreased endurance, decreased mobility, decreased ROM, increased fascial restrictions, increased muscle spasms, impaired flexibility, impaired sensation, impaired UE functional use, improper body mechanics, postural  dysfunction, and pain.   GOALS: Goals reviewed with patient? Yes  SHORT TERM GOALS: Target date: 01/04/2023   Independent in initial HEP  Baseline:  Goal status: met  2.  Initiate postural correction and education  Baseline:  Goal status: met     LONG TERM GOALS: Target date: 02/01/2023  Improve posture and alignment with patient to demonstrate improved upright posture with posterior shoulder girdle engaged  Baseline:  Goal status: on going   2.  Increase cervical ROM and ease of movement in rotation and lateral flexion  Baseline:  Goal status: on going  3.  Decrease frequency and duration of Rt UE radicular symptoms by 80-100%  Baseline:  Goal status: INITIAL  4.  Patient demonstrates and reports ability to stand and sit with activation of posterior shoulder girdle musculature - increase strength  Baseline:  Goal status: on going  5.  Independent in HEP including aquatic program as indicated  Baseline:  Goal status: on goin  6.  Improve functional limitation score to 62 Baseline: 50   01/04/23 - 68 Goal status: met   PLAN:  PT FREQUENCY: 2x/week  PT DURATION: 8 weeks  PLANNED INTERVENTIONS: Therapeutic exercises, Therapeutic activity, Neuromuscular re-education, Patient/Family education, Self Care, Joint mobilization, Aquatic Therapy, Dry Needling, Electrical stimulation, Spinal mobilization, Cryotherapy, Moist heat, Taping, Traction, Ultrasound, Ionotophoresis 4mg /ml Dexamethasone, Manual therapy, and Re-evaluation  PLAN FOR NEXT SESSION: review and progress exercises; continue postural correction and education; manual work and modalities as indicated (no DN at pt request)    Everardo All, PT 01/04/2023, 2:04 PM

## 2023-01-11 ENCOUNTER — Ambulatory Visit (AMBULATORY_SURGERY_CENTER): Payer: Commercial Managed Care - PPO | Admitting: Gastroenterology

## 2023-01-11 ENCOUNTER — Encounter: Payer: Self-pay | Admitting: Gastroenterology

## 2023-01-11 VITALS — BP 113/76 | HR 82 | Temp 97.5°F | Resp 18 | Ht 66.0 in | Wt 245.8 lb

## 2023-01-11 DIAGNOSIS — K635 Polyp of colon: Secondary | ICD-10-CM | POA: Diagnosis not present

## 2023-01-11 DIAGNOSIS — D125 Benign neoplasm of sigmoid colon: Secondary | ICD-10-CM | POA: Diagnosis not present

## 2023-01-11 DIAGNOSIS — Z1211 Encounter for screening for malignant neoplasm of colon: Secondary | ICD-10-CM | POA: Diagnosis not present

## 2023-01-11 DIAGNOSIS — D123 Benign neoplasm of transverse colon: Secondary | ICD-10-CM | POA: Diagnosis not present

## 2023-01-11 DIAGNOSIS — F419 Anxiety disorder, unspecified: Secondary | ICD-10-CM | POA: Diagnosis not present

## 2023-01-11 DIAGNOSIS — D122 Benign neoplasm of ascending colon: Secondary | ICD-10-CM

## 2023-01-11 DIAGNOSIS — I10 Essential (primary) hypertension: Secondary | ICD-10-CM | POA: Diagnosis not present

## 2023-01-11 DIAGNOSIS — F32A Depression, unspecified: Secondary | ICD-10-CM | POA: Diagnosis not present

## 2023-01-11 DIAGNOSIS — J439 Emphysema, unspecified: Secondary | ICD-10-CM | POA: Diagnosis not present

## 2023-01-11 MED ORDER — SODIUM CHLORIDE 0.9 % IV SOLN: 500.0000 mL | Freq: Once | INTRAVENOUS | Status: DC

## 2023-01-11 NOTE — Progress Notes (Signed)
Vss nad trans to pacu °

## 2023-01-11 NOTE — Patient Instructions (Signed)
Next colonoscopy based on pathology results.  Eat a high fiber diet. Continue present medications.    YOU HAD AN ENDOSCOPIC PROCEDURE TODAY AT [facility]:   Refer to the procedure report that was given to you for any specific questions about what was found during the examination.  If the procedure report does not answer your questions, please call your gastroenterologist to clarify.  If you requested that your care partner not be given the details of your procedure findings, then the procedure report has been included in a sealed envelope for you to review at your convenience later.  YOU SHOULD EXPECT: Some feelings of bloating in the abdomen. Passage of more gas than usual.  Walking can help get rid of the air that was put into your GI tract during the procedure and reduce the bloating. If you had a lower endoscopy (such as a colonoscopy or flexible sigmoidoscopy) you may notice spotting of blood in your stool or on the toilet paper. If you underwent a bowel prep for your procedure, you may not have a normal bowel movement for a few days.  Please Note:  You might notice some irritation and congestion in your nose or some drainage.  This is from the oxygen used during your procedure.  There is no need for concern and it should clear up in a day or so.  SYMPTOMS TO REPORT IMMEDIATELY:  Following lower endoscopy (colonoscopy or flexible sigmoidoscopy):  Excessive amounts of blood in the stool  Significant tenderness or worsening of abdominal pains  Swelling of the abdomen that is new, acute  Fever of 100F or higher  For urgent or emergent issues, a gastroenterologist can be reached at any hour by calling [phone]. Do not use MyChart messaging for urgent concerns.    DIET:  We do recommend a small meal at first, but then you may proceed to your regular diet.  Drink plenty of fluids but you should avoid alcoholic beverages for 24 hours.  ACTIVITY:  You should plan to  take it easy for the rest of today and you should NOT DRIVE or use heavy machinery until tomorrow (because of the sedation medicines used during the test).    FOLLOW UP: Our staff will call the number listed on your records the next business day following your procedure.  We will call around 7:15- 8:00 am to check on you and address any questions or concerns that you may have regarding the information given to you following your procedure. If we do not reach you, we will leave a message.     If any biopsies were taken you will be contacted by phone or by letter within the next 1-3 weeks.  Please call us at [phone] if you have not heard about the biopsies in 3 weeks.    SIGNATURES/CONFIDENTIALITY: You and/or your care partner have signed paperwork which will be entered into your electronic medical record.  These signatures attest to the fact that that the information above on your After Visit Summary has been reviewed and is understood.  Full responsibility of the confidentiality of this discharge information lies with you and/or your care-partner.

## 2023-01-11 NOTE — Progress Notes (Signed)
Called to room to assist during endoscopic procedure.  Patient ID and intended procedure confirmed with present staff. Received instructions for my participation in the procedure from the performing physician.  

## 2023-01-11 NOTE — Progress Notes (Signed)
History & Physical  Primary Care Physician:  Samuel Bouche, NP Primary Gastroenterologist: Lucio Edward, MD  Impression / Plan:  CRC screening, average risk, for colonoscopy.  CHIEF COMPLAINT:  CRC screening   HPI: Keyli Duross is a 52 y.o. female for CRC screening, average risk, with colonoscopy.   Past Medical History:  Diagnosis Date   Anxiety    Depression    Emphysema of lung ([place])    Hay fever    Hypertension    Psoriasis     Past Surgical History:  Procedure Laterality Date   WISDOM TOOTH EXTRACTION  1988   WISDOM TOOTH EXTRACTION      Prior to Admission medications   Medication Sig Start Date End Date Taking? Authorizing Provider  losartan (COZAAR) 100 MG tablet Take 1 tablet by mouth daily. 11/16/22  Yes Samuel Bouche, NP  traZODone (DESYREL) 50 MG tablet Take 1 tablet (50 mg total) by mouth at bedtime. 08/12/22  Yes Samuel Bouche, NP  ALPRAZolam Duanne Moron) 0.25 MG tablet Take 1/2 - 1 tablet (0.125 - 0.25 mg total) by mouth daily as needed for anxiety 11/16/22   Samuel Bouche, NP  clobetasol ointment (TEMOVATE) 0.05 % Apply to affected areas twice a day for 14 days. 11/15/22     Guselkumab (TREMFYA) 100 MG/ML SOPN Inject '100mg'$  subcutaneously every 8 weeks as directed 08/25/22   Tresa Garter, MD  methocarbamol (ROBAXIN) 500 MG tablet Take 1 tablet (500 mg total) by mouth daily as needed for muscle spasms. 08/12/22   Samuel Bouche, NP    Current Outpatient Medications  Medication Sig Dispense Refill   losartan (COZAAR) 100 MG tablet Take 1 tablet by mouth daily. 90 tablet 3   traZODone (DESYREL) 50 MG tablet Take 1 tablet (50 mg total) by mouth at bedtime. 90 tablet 1   ALPRAZolam (XANAX) 0.25 MG tablet Take 1/2 - 1 tablet (0.125 - 0.25 mg total) by mouth daily as needed for anxiety 30 tablet 0   clobetasol ointment (TEMOVATE) 0.05 % Apply to affected areas twice a day for 14 days. 60 g 0   Guselkumab (TREMFYA) 100 MG/ML SOPN Inject '100mg'$  subcutaneously every 8 weeks  as directed 1 mL 1   methocarbamol (ROBAXIN) 500 MG tablet Take 1 tablet (500 mg total) by mouth daily as needed for muscle spasms. 30 tablet 1   Current Facility-Administered Medications  Medication Dose Route Frequency Provider Last Rate Last Admin   0.9 %  sodium chloride infusion  500 mL Intravenous Once Ladene Artist, MD        Allergies as of 01/11/2023 - Review Complete 01/11/2023  Allergen Reaction Noted   Pollen extract  02/26/2016   Wound dressing adhesive Dermatitis 12/02/2021   Lisinopril  06/22/2019   Tape Dermatitis, Itching, and Rash     Family History  Problem Relation Age of Onset   Cancer Mother    Breast cancer Mother    Hypertension Mother    Hypertension Father    Tongue cancer Father    Colon cancer Neg Hx    Colon polyps Neg Hx    Esophageal cancer Neg Hx    Rectal cancer Neg Hx    Stomach cancer Neg Hx     Social History   Socioeconomic History   Marital status: Single    Spouse name: Not on file   Number of children: Not on file   Years of education: Not on file   Highest education level: Not on file  Occupational  History   Occupation: Programmer, multimedia: [employer]  Tobacco Use   Smoking status: Former    Packs/day: 0.50    Years: 30.00    Total pack years: 15.00    Types: Cigarettes    Quit date: 11/05/2010    Years since quitting: 12.1   Smokeless tobacco: Never  Vaping Use   Vaping Use: Never used  Substance and Sexual Activity   Alcohol use: Yes    Comment: rarely   Drug use: Yes    Types: Marijuana    Comment: sometimes   Sexual activity: Not Currently    Partners: Female    Birth control/protection: Abstinence, Post-menopausal  Other Topics Concern   Not on file  Social History Narrative   Not on file   Social Determinants of Health   Financial Resource Strain: Not on file  Food Insecurity: Not on file  Transportation Needs: Not on file  Physical Activity: Not on file  Stress: Not on file  Social Connections: Not  on file  Intimate Partner Violence: Not on file    Review of Systems:  All systems reviewed were negative except where noted in HPI.   Physical Exam: General:  Alert, well-developed, in NAD Head:  Normocephalic and atraumatic. Eyes:  Sclera clear, no icterus.   Conjunctiva pink. Ears:  Normal auditory acuity. Mouth:  No deformity or lesions.  Neck:  Supple; no masses. Lungs:  Clear throughout to auscultation.   No wheezes, crackles, or rhonchi.  Heart:  Regular rate and rhythm; no murmurs. Abdomen:  Soft, nondistended, nontender. No masses, hepatomegaly. No palpable masses.  Normal bowel sounds.    Rectal:  Deferred   Msk:  Symmetrical without gross deformities. Extremities:  Without edema. Neurologic:  Alert and  oriented x 4; grossly normal neurologically. Skin:  Intact without significant lesions or rashes. Psych:  Alert and cooperative. Normal mood and affect.   Pricilla Riffle. Fuller Plan  01/11/2023, 10:53 AM See Shea Evans, [facility], to contact our on call provider

## 2023-01-11 NOTE — Op Note (Signed)
[facility] Patient Name: Leslie Wiggins Procedure Date: 01/11/2023 11:00 AM MRN: [id number] Endoscopist: Ladene Artist , MD, [id number] Age: 52 Referring MD:  Date of Birth: 10/15/1971 Gender: Female Account #: [id number] Procedure:                Colonoscopy Indications:              Screening for colorectal malignant neoplasm Medicines:                Monitored Anesthesia Care Procedure:                Pre-Anesthesia Assessment:                           - Prior to the procedure, a History and Physical                            was performed, and patient medications and                            allergies were reviewed. The patient's tolerance of                            previous anesthesia was also reviewed. The risks                            and benefits of the procedure and the sedation                            options and risks were discussed with the patient.                            All questions were answered, and informed consent                            was obtained. Prior Anticoagulants: The patient has                            taken no anticoagulant or antiplatelet agents. ASA                            Grade Assessment: II - A patient with mild systemic                            disease. After reviewing the risks and benefits,                            the patient was deemed in satisfactory condition to                            undergo the procedure.                           After obtaining informed consent, the colonoscope  was passed under direct vision. Throughout the                            procedure, the patient's blood pressure, pulse, and                            oxygen saturations were monitored continuously. The                            CF [id number] #[id number] was introduced through the anus                            and advanced to the the cecum, identified by                            appendiceal  orifice and ileocecal valve. The                            ileocecal valve, appendiceal orifice, and rectum                            were photographed. The quality of the bowel                            preparation was good. The colonoscopy was performed                            without difficulty. The patient tolerated the                            procedure well. Scope In: 11:06:43 AM Scope Out: 11:22:57 AM Scope Withdrawal Time: 0 hours 13 minutes 54 seconds  Total Procedure Duration: 0 hours 16 minutes 14 seconds  Findings:                 The perianal and digital rectal examinations were                            normal.                           A 4 mm polyp was found in the ascending colon. The                            polyp was sessile. The polyp was removed with a                            cold biopsy forceps. Resection and retrieval were                            complete.                           Three sessile polyps were found in the sigmoid  colon (2) and transverse colon (1). The polyps were                            6 to 8 mm in size. These polyps were removed with a                            cold snare. Resection and retrieval were complete.                           A few small-mouthed diverticula were found in the                            left colon. There was no evidence of diverticular                            bleeding.                           Patchy areas of mild melanosis were found in the                            rectum and in the sigmoid colon.                           Internal hemorrhoids were found during                            retroflexion. The hemorrhoids were small and Grade                            I (internal hemorrhoids that do not prolapse).                           The exam was otherwise without abnormality on                            direct and retroflexion views. Complications:            No  immediate complications. Estimated blood loss:                            None. Estimated Blood Loss:     Estimated blood loss: none. Impression:               - One 4 mm polyp in the ascending colon, removed                            with a cold biopsy forceps. Resected and retrieved.                           - Three 6 to 8 mm polyps in the sigmoid colon and                            in the transverse colon, removed with a  cold snare.                            Resected and retrieved.                           - Very dild left colonoscopy diverticulosis.                           - Mild melanosis in the colon.                           - Internal hemorrhoids.                           - The examination was otherwise normal on direct                            and retroflexion views. Recommendation:           - Repeat colonoscopy after studies are complete for                            surveillance based on pathology results.                           - Patient has a contact number available for                            emergencies. The signs and symptoms of potential                            delayed complications were discussed with the                            patient. Return to normal activities tomorrow.                            Written discharge instructions were provided to the                            patient.                           - High fiber diet.                           - Continue present medications.                           - Await pathology results. Ladene Artist, MD 01/11/2023 11:29:12 AM This report has been signed electronically.

## 2023-01-12 ENCOUNTER — Telehealth: Payer: Self-pay | Admitting: *Deleted

## 2023-01-12 NOTE — Telephone Encounter (Signed)
Attempted to call patient for their post-procedure follow-up call. No answer. Left voicemail.   

## 2023-01-18 ENCOUNTER — Ambulatory Visit: Payer: Commercial Managed Care - PPO | Admitting: Rehabilitative and Restorative Service Providers"

## 2023-01-20 ENCOUNTER — Encounter: Payer: Self-pay | Admitting: Gastroenterology

## 2023-01-27 ENCOUNTER — Other Ambulatory Visit (HOSPITAL_COMMUNITY): Payer: Self-pay

## 2023-01-31 ENCOUNTER — Other Ambulatory Visit: Payer: Self-pay

## 2023-02-01 ENCOUNTER — Other Ambulatory Visit (HOSPITAL_COMMUNITY): Payer: Self-pay

## 2023-02-01 ENCOUNTER — Other Ambulatory Visit: Payer: Self-pay | Admitting: Medical-Surgical

## 2023-02-01 MED ORDER — METHOCARBAMOL 500 MG PO TABS
500.0000 mg | ORAL_TABLET | Freq: Every day | ORAL | 1 refills | Status: DC | PRN
  Filled 2023-02-01: qty 30, 30d supply, fill #0
  Filled 2023-04-17: qty 30, 30d supply, fill #1

## 2023-02-09 ENCOUNTER — Other Ambulatory Visit (HOSPITAL_COMMUNITY): Payer: Self-pay

## 2023-02-17 ENCOUNTER — Other Ambulatory Visit (HOSPITAL_COMMUNITY): Payer: Self-pay

## 2023-02-17 DIAGNOSIS — M79671 Pain in right foot: Secondary | ICD-10-CM | POA: Diagnosis not present

## 2023-02-17 DIAGNOSIS — L409 Psoriasis, unspecified: Secondary | ICD-10-CM | POA: Diagnosis not present

## 2023-02-17 DIAGNOSIS — R5383 Other fatigue: Secondary | ICD-10-CM | POA: Diagnosis not present

## 2023-02-17 DIAGNOSIS — E010 Iodine-deficiency related diffuse (endemic) goiter: Secondary | ICD-10-CM | POA: Diagnosis not present

## 2023-02-17 DIAGNOSIS — M1991 Primary osteoarthritis, unspecified site: Secondary | ICD-10-CM | POA: Diagnosis not present

## 2023-02-17 DIAGNOSIS — M79672 Pain in left foot: Secondary | ICD-10-CM | POA: Diagnosis not present

## 2023-02-17 DIAGNOSIS — M545 Low back pain, unspecified: Secondary | ICD-10-CM | POA: Diagnosis not present

## 2023-02-17 DIAGNOSIS — Z6841 Body Mass Index (BMI) 40.0 and over, adult: Secondary | ICD-10-CM | POA: Diagnosis not present

## 2023-02-17 MED ORDER — MELOXICAM 15 MG PO TABS
ORAL_TABLET | ORAL | 3 refills | Status: DC
  Filled 2023-02-17: qty 30, 30d supply, fill #0
  Filled 2023-03-22: qty 30, 30d supply, fill #1

## 2023-02-18 ENCOUNTER — Other Ambulatory Visit (HOSPITAL_COMMUNITY): Payer: Self-pay

## 2023-03-21 DIAGNOSIS — L409 Psoriasis, unspecified: Secondary | ICD-10-CM | POA: Diagnosis not present

## 2023-03-21 DIAGNOSIS — M1991 Primary osteoarthritis, unspecified site: Secondary | ICD-10-CM | POA: Diagnosis not present

## 2023-03-21 DIAGNOSIS — M545 Low back pain, unspecified: Secondary | ICD-10-CM | POA: Diagnosis not present

## 2023-03-21 DIAGNOSIS — M79671 Pain in right foot: Secondary | ICD-10-CM | POA: Diagnosis not present

## 2023-03-21 DIAGNOSIS — Z6841 Body Mass Index (BMI) 40.0 and over, adult: Secondary | ICD-10-CM | POA: Diagnosis not present

## 2023-03-21 DIAGNOSIS — M79672 Pain in left foot: Secondary | ICD-10-CM | POA: Diagnosis not present

## 2023-03-25 ENCOUNTER — Other Ambulatory Visit (HOSPITAL_COMMUNITY): Payer: Self-pay

## 2023-03-28 ENCOUNTER — Other Ambulatory Visit: Payer: Self-pay | Admitting: Pharmacist

## 2023-03-28 ENCOUNTER — Other Ambulatory Visit: Payer: Self-pay

## 2023-03-28 ENCOUNTER — Other Ambulatory Visit (HOSPITAL_COMMUNITY): Payer: Self-pay

## 2023-03-28 MED ORDER — TREMFYA 100 MG/ML ~~LOC~~ SOSY: PREFILLED_SYRINGE | SUBCUTANEOUS | 1 refills | Status: DC

## 2023-03-28 MED ORDER — TREMFYA 100 MG/ML ~~LOC~~ SOSY
PREFILLED_SYRINGE | SUBCUTANEOUS | 1 refills | Status: DC
  Filled 2023-03-28: qty 1, 60d supply, fill #0
  Filled 2023-03-29: qty 1, 56d supply, fill #0
  Filled 2023-05-19: qty 1, 56d supply, fill #1

## 2023-03-29 ENCOUNTER — Other Ambulatory Visit (HOSPITAL_COMMUNITY): Payer: Self-pay

## 2023-03-30 ENCOUNTER — Other Ambulatory Visit (HOSPITAL_COMMUNITY): Payer: Self-pay

## 2023-03-31 ENCOUNTER — Other Ambulatory Visit (HOSPITAL_COMMUNITY): Payer: Self-pay

## 2023-04-14 ENCOUNTER — Other Ambulatory Visit (HOSPITAL_COMMUNITY): Payer: Self-pay

## 2023-04-17 ENCOUNTER — Other Ambulatory Visit: Payer: Self-pay | Admitting: Medical-Surgical

## 2023-04-17 DIAGNOSIS — G4726 Circadian rhythm sleep disorder, shift work type: Secondary | ICD-10-CM

## 2023-04-18 ENCOUNTER — Other Ambulatory Visit: Payer: Self-pay

## 2023-04-18 MED ORDER — TRAZODONE HCL 50 MG PO TABS
50.0000 mg | ORAL_TABLET | Freq: Every day | ORAL | 0 refills | Status: DC
  Filled 2023-04-18: qty 90, 90d supply, fill #0

## 2023-04-19 ENCOUNTER — Other Ambulatory Visit (HOSPITAL_COMMUNITY): Payer: Self-pay

## 2023-04-20 ENCOUNTER — Other Ambulatory Visit (HOSPITAL_COMMUNITY): Payer: Self-pay

## 2023-05-10 ENCOUNTER — Other Ambulatory Visit: Payer: Self-pay

## 2023-05-10 ENCOUNTER — Other Ambulatory Visit (HOSPITAL_COMMUNITY): Payer: Self-pay

## 2023-05-10 DIAGNOSIS — L403 Pustulosis palmaris et plantaris: Secondary | ICD-10-CM | POA: Diagnosis not present

## 2023-05-10 DIAGNOSIS — Z5181 Encounter for therapeutic drug level monitoring: Secondary | ICD-10-CM | POA: Diagnosis not present

## 2023-05-10 MED ORDER — TREMFYA 100 MG/ML ~~LOC~~ SOSY: 1.0000 mL | PREFILLED_SYRINGE | SUBCUTANEOUS | 3 refills | Status: DC

## 2023-05-17 ENCOUNTER — Ambulatory Visit (INDEPENDENT_AMBULATORY_CARE_PROVIDER_SITE_OTHER): Payer: Commercial Managed Care - PPO | Admitting: Medical-Surgical

## 2023-05-17 ENCOUNTER — Encounter: Payer: Self-pay | Admitting: Medical-Surgical

## 2023-05-17 ENCOUNTER — Other Ambulatory Visit (HOSPITAL_COMMUNITY): Payer: Self-pay

## 2023-05-17 VITALS — BP 112/70 | HR 87 | Resp 20 | Ht 66.0 in | Wt 272.0 lb

## 2023-05-17 DIAGNOSIS — J439 Emphysema, unspecified: Secondary | ICD-10-CM

## 2023-05-17 DIAGNOSIS — I700 Atherosclerosis of aorta: Secondary | ICD-10-CM

## 2023-05-17 DIAGNOSIS — I10 Essential (primary) hypertension: Secondary | ICD-10-CM | POA: Diagnosis not present

## 2023-05-17 DIAGNOSIS — R5383 Other fatigue: Secondary | ICD-10-CM | POA: Insufficient documentation

## 2023-05-17 DIAGNOSIS — Z6841 Body Mass Index (BMI) 40.0 and over, adult: Secondary | ICD-10-CM

## 2023-05-17 DIAGNOSIS — F331 Major depressive disorder, recurrent, moderate: Secondary | ICD-10-CM | POA: Insufficient documentation

## 2023-05-17 DIAGNOSIS — M545 Low back pain, unspecified: Secondary | ICD-10-CM | POA: Insufficient documentation

## 2023-05-17 DIAGNOSIS — F418 Other specified anxiety disorders: Secondary | ICD-10-CM

## 2023-05-17 DIAGNOSIS — M1991 Primary osteoarthritis, unspecified site: Secondary | ICD-10-CM | POA: Insufficient documentation

## 2023-05-17 DIAGNOSIS — L409 Psoriasis, unspecified: Secondary | ICD-10-CM | POA: Insufficient documentation

## 2023-05-17 DIAGNOSIS — M62838 Other muscle spasm: Secondary | ICD-10-CM

## 2023-05-17 LAB — CBC WITH DIFFERENTIAL/PLATELET
Absolute Monocytes: 578 cells/uL (ref 200–950)
HCT: 41.8 % (ref 35.0–45.0)
Lymphs Abs: 3597 cells/uL (ref 850–3900)
MCHC: 34 g/dL (ref 32.0–36.0)
MPV: 9.6 fL (ref 7.5–12.5)
Monocytes Relative: 5.3 %
Neutro Abs: 6551 cells/uL (ref 1500–7800)

## 2023-05-17 MED ORDER — METHOCARBAMOL 500 MG PO TABS
500.0000 mg | ORAL_TABLET | Freq: Every day | ORAL | 1 refills | Status: DC | PRN
  Filled 2023-05-17: qty 90, 90d supply, fill #0

## 2023-05-17 MED ORDER — NALTREXONE-BUPROPION HCL ER 8-90 MG PO TB12
ORAL_TABLET | ORAL | 0 refills | Status: DC
  Filled 2023-05-17: qty 120, 41d supply, fill #0
  Filled 2023-06-15: qty 120, 30d supply, fill #0

## 2023-05-17 NOTE — Progress Notes (Signed)
        Established patient visit  History, exam, impression, and plan:  1. Situational anxiety 2. Major depressive disorder, recurrent episode, moderate with anxious distress (HCC) Reports that she has been doing fairly well with her anxiety and depression however she does still have periods of tearfulness.  She is still working to clear out her mother's belongings since her death and this often leads to unexpected moments where she catches a certain smell or sees a certain object that makes her emotional.  Denies SI/HI.  Has Xanax at home but has not been using this regularly.  Feels that her symptoms are fairly well-managed.  Continue very sparing use only for severe anxiety that is not responsive to other measures.  3. Pulmonary emphysema, unspecified emphysema type (HCC) She does have a history of pulmonary emphysema however has had no concerns with respiratory issues recently.  Has shortness of breath, coughing, wheezing and sputum production.  Lungs CTA.  Respirations even and unlabored.  4. Aortic atherosclerosis (HCC) History of aortic atherosclerosis but not currently on lipid-lowering therapy.  No special dietary modifications and not currently exercising.  Rechecking lipids today.  5. Morbid obesity with body mass index (BMI) of 40.0 to 44.9 in adult [facility]) Checking hemoglobin A1c and TSH.  Has had approximately 40 pound weight gain in the past several months.  We did briefly discuss medications that may benefit.  She is interested in an option that does not have significant interactions with her current medications.  I did review these and it looks like Contrave will be a good option.  This will likely help with emotional eating as well as some of her anxiety/depression symptoms as noted above.  Starting Contrave with taper over 4 weeks.  She will let me know if she is able to get this from her pharmacy and how she is doing on it in about 3-4 weeks. - TSH - Hemoglobin A1c  6. Primary  hypertension Currently taking losartan 100 mg daily, tolerating well without side effects.  Not regularly monitoring blood pressure at home.  No exercise or dietary modifications right now.  Denies concerning symptoms.  Checking labs as below.  Blood pressure at goal today.  Continue losartan as prescribed. - CBC with Differential/Platelet - COMPLETE METABOLIC PANEL WITH GFR - Lipid panel  7. Muscle spasm Using methocarbamol 500 mg on an as-needed basis for the chronic right lower extremity discomfort that she feels.  This has been present for several years and does not seem to respond to anti-inflammatories but the methocarbamol is helping.  Refilling today.  Procedures performed this visit: None.  Return in about 2 months (around 08/01/2023) for annual physical exam.  __________________________________ Thayer Ohm, DNP, APRN, FNP-BC Primary Care and Sports Medicine [facility]

## 2023-05-18 ENCOUNTER — Other Ambulatory Visit: Payer: Self-pay

## 2023-05-18 LAB — LIPID PANEL
Cholesterol: 171 mg/dL (ref <200)
HDL: 58 mg/dL (ref >=50)
LDL Cholesterol (Calc): 88 mg/dL (calc)
Non-HDL Cholesterol (Calc): 113 mg/dL (calc) (ref <130)
Total CHOL/HDL Ratio: 2.9 (calc) (ref <5.0)
Triglycerides: 152 mg/dL — ABNORMAL HIGH (ref <150)

## 2023-05-18 LAB — COMPLETE METABOLIC PANEL WITH GFR
AG Ratio: 1.4 (calc) (ref 1.0–2.5)
ALT: 13 U/L (ref 6–29)
AST: 14 U/L (ref 10–35)
Albumin: 3.9 g/dL (ref 3.6–5.1)
Alkaline phosphatase (APISO): 131 U/L (ref 37–153)
BUN: 13 mg/dL (ref 7–25)
CO2: 26 mmol/L (ref 20–32)
Calcium: 8.8 mg/dL (ref 8.6–10.4)
Chloride: 104 mmol/L (ref 98–110)
Creat: 0.66 mg/dL (ref 0.50–1.03)
Globulin: 2.7 g/dL (calc) (ref 1.9–3.7)
Glucose, Bld: 88 mg/dL (ref 65–99)
Potassium: 4.3 mmol/L (ref 3.5–5.3)
Sodium: 140 mmol/L (ref 135–146)
Total Bilirubin: 0.7 mg/dL (ref 0.2–1.2)
Total Protein: 6.6 g/dL (ref 6.1–8.1)
eGFR: 105 mL/min/{1.73_m2} (ref >=60)

## 2023-05-18 LAB — TSH: TSH: 0.43 mIU/L

## 2023-05-18 LAB — CBC WITH DIFFERENTIAL/PLATELET
Basophils Absolute: 65 cells/uL (ref 0–200)
Basophils Relative: 0.6 %
Eosinophils Absolute: 109 cells/uL (ref 15–500)
Eosinophils Relative: 1 %
Hemoglobin: 14.2 g/dL (ref 11.7–15.5)
MCH: 29.8 pg (ref 27.0–33.0)
MCV: 87.8 fL (ref 80.0–100.0)
Neutrophils Relative %: 60.1 %
Platelets: 361 10*3/uL (ref 140–400)
RBC: 4.76 10*6/uL (ref 3.80–5.10)
RDW: 12.4 % (ref 11.0–15.0)
Total Lymphocyte: 33 %
WBC: 10.9 10*3/uL — ABNORMAL HIGH (ref 3.8–10.8)

## 2023-05-18 LAB — HEMOGLOBIN A1C
Hgb A1c MFr Bld: 5.6 % of total Hgb (ref <5.7)
Mean Plasma Glucose: 114 mg/dL
eAG (mmol/L): 6.3 mmol/L

## 2023-05-19 ENCOUNTER — Other Ambulatory Visit: Payer: Self-pay

## 2023-05-19 ENCOUNTER — Other Ambulatory Visit (HOSPITAL_COMMUNITY): Payer: Self-pay

## 2023-05-24 ENCOUNTER — Other Ambulatory Visit (HOSPITAL_COMMUNITY): Payer: Self-pay

## 2023-05-25 ENCOUNTER — Encounter: Payer: Self-pay | Admitting: Medical-Surgical

## 2023-05-26 ENCOUNTER — Other Ambulatory Visit (HOSPITAL_COMMUNITY): Payer: Self-pay

## 2023-05-27 ENCOUNTER — Other Ambulatory Visit (HOSPITAL_COMMUNITY): Payer: Self-pay

## 2023-06-03 ENCOUNTER — Telehealth: Payer: Self-pay

## 2023-06-03 NOTE — Telephone Encounter (Signed)
Initiated Prior authorization for: Contrave 8-90MG  er tablets  Via: Covermymeds Case/Key:[id number] Status: Pending as of 06/03/23 Reason: Notified Pt via: Mychart

## 2023-06-06 ENCOUNTER — Other Ambulatory Visit (HOSPITAL_COMMUNITY): Payer: Self-pay

## 2023-06-07 ENCOUNTER — Other Ambulatory Visit (HOSPITAL_COMMUNITY): Payer: Self-pay

## 2023-06-15 ENCOUNTER — Other Ambulatory Visit (HOSPITAL_COMMUNITY): Payer: Self-pay

## 2023-06-16 ENCOUNTER — Other Ambulatory Visit (HOSPITAL_COMMUNITY): Payer: Self-pay

## 2023-06-17 ENCOUNTER — Other Ambulatory Visit: Payer: Self-pay

## 2023-06-17 ENCOUNTER — Other Ambulatory Visit: Payer: Self-pay | Admitting: Pharmacist

## 2023-06-17 ENCOUNTER — Other Ambulatory Visit (HOSPITAL_COMMUNITY): Payer: Self-pay

## 2023-06-17 MED ORDER — TREMFYA 100 MG/ML ~~LOC~~ SOSY
100.0000 mg | PREFILLED_SYRINGE | SUBCUTANEOUS | 3 refills | Status: DC
  Filled 2023-06-17 – 2023-07-14 (×2): qty 1, 56d supply, fill #0
  Filled 2023-09-09: qty 1, 56d supply, fill #1
  Filled 2023-11-01 – 2023-11-07 (×2): qty 1, 56d supply, fill #2
  Filled 2023-12-22 – 2024-01-03 (×2): qty 1, 56d supply, fill #3

## 2023-06-20 ENCOUNTER — Other Ambulatory Visit (HOSPITAL_COMMUNITY): Payer: Self-pay

## 2023-06-20 MED ORDER — CLOBETASOL PROPIONATE 0.05 % EX OINT
TOPICAL_OINTMENT | CUTANEOUS | 0 refills | Status: AC
  Filled 2023-06-20: qty 60, 14d supply, fill #0

## 2023-06-24 ENCOUNTER — Other Ambulatory Visit (HOSPITAL_COMMUNITY): Payer: Self-pay

## 2023-07-14 ENCOUNTER — Other Ambulatory Visit: Payer: Self-pay

## 2023-07-14 ENCOUNTER — Other Ambulatory Visit (HOSPITAL_COMMUNITY): Payer: Self-pay

## 2023-07-19 ENCOUNTER — Other Ambulatory Visit: Payer: Self-pay

## 2023-08-02 ENCOUNTER — Ambulatory Visit (INDEPENDENT_AMBULATORY_CARE_PROVIDER_SITE_OTHER): Payer: Commercial Managed Care - PPO | Admitting: Medical-Surgical

## 2023-08-02 ENCOUNTER — Encounter: Payer: Self-pay | Admitting: Medical-Surgical

## 2023-08-02 ENCOUNTER — Other Ambulatory Visit (HOSPITAL_COMMUNITY): Payer: Self-pay

## 2023-08-02 VITALS — BP 108/66 | HR 86 | Ht 66.0 in | Wt 271.5 lb

## 2023-08-02 DIAGNOSIS — Z0000 Encounter for general adult medical examination without abnormal findings: Secondary | ICD-10-CM

## 2023-08-02 DIAGNOSIS — Z23 Encounter for immunization: Secondary | ICD-10-CM

## 2023-08-02 DIAGNOSIS — G4726 Circadian rhythm sleep disorder, shift work type: Secondary | ICD-10-CM | POA: Diagnosis not present

## 2023-08-02 MED ORDER — TEMAZEPAM 22.5 MG PO CAPS
22.5000 mg | ORAL_CAPSULE | Freq: Every evening | ORAL | 0 refills | Status: DC | PRN
  Filled 2023-08-02: qty 30, 30d supply, fill #0

## 2023-08-02 MED ORDER — ALBUTEROL SULFATE HFA 108 (90 BASE) MCG/ACT IN AERS
2.0000 | INHALATION_SPRAY | Freq: Four times a day (QID) | RESPIRATORY_TRACT | 0 refills | Status: DC | PRN
  Filled 2023-08-02: qty 6.7, 25d supply, fill #0

## 2023-08-02 NOTE — Progress Notes (Signed)
Complete physical exam  Patient: Leslie Wiggins   DOB: 05/08/1971   52 y.o. Female  MRN: [id number]  Subjective:    Chief Complaint  Patient presents with   Annual Exam   Leslie Wiggins is a 52 y.o. female who presents today for a complete physical exam. She reports consuming a general diet. The patient does not participate in regular exercise at present. She generally feels well. She reports sleeping poorly. She does not have additional problems to discuss today.    Most recent fall risk assessment:    08/02/2023    1:55 PM  Fall Risk   Number falls in past yr: 0  Injury with Fall? 0  Risk for fall due to : No Fall Risks  Follow up Falls evaluation completed     Most recent depression screenings:    08/02/2023    1:55 PM 05/17/2023    2:01 PM  PHQ 2/9 Scores  PHQ - 2 Score 0 0  PHQ- 9 Score  3    Vision:Not within last year , Dental: No current dental problems and Receives regular dental care, and STD: The patient denies history of sexually transmitted disease.    Patient Care Team: Christen Butter, NP as PCP - General (Nurse Practitioner)   Outpatient Medications Prior to Visit  Medication Sig   clobetasol ointment (TEMOVATE) 0.05 % Apply to affected areas twice a day for 14 days.   guselkumab (TREMFYA) 100 MG/ML prefilled syringe Inject 1 mL (100 mg total) into the skin every 8 (eight) weeks.   losartan (COZAAR) 100 MG tablet Take 1 tablet by mouth daily.   meloxicam (MOBIC) 15 MG tablet Take 1 tablet by mouth once daily   methocarbamol (ROBAXIN) 500 MG tablet Take 1 tablet (500 mg total) by mouth daily as needed for muscle spasms.   [DISCONTINUED] ALPRAZolam (XANAX) 0.25 MG tablet Take 1/2 - 1 tablet (0.125 - 0.25 mg total) by mouth daily as needed for anxiety   [DISCONTINUED] Naltrexone-buPROPion HCl ER 8-90 MG TB12 Take 1 tab daily for 1 week, 1 tab twice daily for week 2,  2 tabs every morning and 1 tab every evening for week 3, then 2 tabs twice daily.    [DISCONTINUED] traZODone (DESYREL) 50 MG tablet Take 1 tablet (50 mg total) by mouth at bedtime.   No facility-administered medications prior to visit.    Review of Systems  Constitutional:  Negative for chills, fever, malaise/fatigue and weight loss.  HENT:  Negative for congestion, ear pain, hearing loss, sinus pain and sore throat.   Eyes:  Negative for blurred vision, photophobia and pain.  Respiratory:  Negative for cough, shortness of breath and wheezing.   Cardiovascular:  Negative for chest pain, palpitations and leg swelling.  Gastrointestinal:  Negative for abdominal pain, constipation, diarrhea, heartburn, nausea and vomiting.  Genitourinary:  Negative for dysuria, frequency and urgency.  Musculoskeletal:  Negative for falls and neck pain.  Skin:  Negative for itching and rash.  Neurological:  Negative for dizziness, weakness and headaches.  Endo/Heme/Allergies:  Negative for polydipsia. Does not bruise/bleed easily.  Psychiatric/Behavioral:  Negative for depression, substance abuse and suicidal ideas. The patient has insomnia. The patient is not nervous/anxious.      Objective:    BP 108/66   Pulse 86   Ht 5\' 6"  (1.676 m)   Wt 271 lb 8 oz (123.2 kg)   LMP 12/09/2021   SpO2 93%   BMI 43.82 kg/m    Physical Exam Vitals  reviewed.  Constitutional:      General: She is not in acute distress.    Appearance: Normal appearance. She is obese. She is not ill-appearing.  HENT:     Head: Normocephalic and atraumatic.     Right Ear: Tympanic membrane, ear canal and external ear normal. There is no impacted cerumen.     Left Ear: Tympanic membrane, ear canal and external ear normal. There is no impacted cerumen.     Nose: Nose normal. No congestion or rhinorrhea.     Mouth/Throat:     Mouth: Mucous membranes are moist.     Pharynx: No oropharyngeal exudate or posterior oropharyngeal erythema.  Eyes:     General: No scleral icterus.       Right eye: No discharge.         Left eye: No discharge.     Extraocular Movements: Extraocular movements intact.     Conjunctiva/sclera: Conjunctivae normal.     Pupils: Pupils are equal, round, and reactive to light.  Neck:     Thyroid: No thyromegaly.     Vascular: No carotid bruit or JVD.     Trachea: Trachea normal.  Cardiovascular:     Rate and Rhythm: Normal rate and regular rhythm.     Pulses: Normal pulses.     Heart sounds: Normal heart sounds. No murmur heard.    No friction rub. No gallop.  Pulmonary:     Effort: Pulmonary effort is normal. No respiratory distress.     Breath sounds: Normal breath sounds. No wheezing.  Abdominal:     General: Bowel sounds are normal. There is no distension.     Palpations: Abdomen is soft.     Tenderness: There is no abdominal tenderness. There is no guarding.  Musculoskeletal:        General: Normal range of motion.     Cervical back: Normal range of motion and neck supple.  Lymphadenopathy:     Cervical: No cervical adenopathy.  Skin:    General: Skin is warm and dry.  Neurological:     Mental Status: She is alert and oriented to person, place, and time.     Cranial Nerves: No cranial nerve deficit.  Psychiatric:        Mood and Affect: Mood normal.        Behavior: Behavior normal.        Thought Content: Thought content normal.        Judgment: Judgment normal.      No results found for any visits on 08/02/23.     Assessment & Plan:    Routine Health Maintenance and Physical Exam  Immunization History  Administered Date(s) Administered   Influenza-Unspecified 09/05/2021   PFIZER(Purple Top)SARS-COV-2 Vaccination 12/14/2019, 01/04/2020, 09/18/2020   Tdap 03/06/2020   Zoster Recombinant(Shingrix) 08/02/2023    Health Maintenance  Topic Date Due   INFLUENZA VACCINE  07/07/2023   COVID-19 Vaccine (4 - 2023-24 season) 08/18/2023 (Originally 08/06/2022)   Hepatitis C Screening  05/17/2024 (Originally [birth year]/1988)   Zoster Vaccines- Shingrix (2 of 2)  09/27/2023   MAMMOGRAM  11/17/2024   PAP SMEAR-Modifier  11/16/2025   Colonoscopy  01/11/2026   DTaP/Tdap/Td (2 - Td or Tdap) 03/06/2030   HIV Screening  Completed   HPV VACCINES  Aged Out    Discussed health benefits of physical activity, and encouraged her to engage in regular exercise appropriate for her age and condition.  1. Annual physical exam Recommend updating dental and vision care.  Labs checked in June so deferring further blood work today.  Wellness information provided with AVS.  2. Shift work sleep disorder Unable to tolerate trazodone due to excess sedation and grogginess.  Tried Remeron from one of her friends but this also left her very sleepy and unable to function.  Did well with temazepam in the past and would like to switch back.  15 mg nightly was not helpful and 30 mg nightly seem to work well however she is amenable to trialing a middle dose.  Sending in temazepam 22.5 mg before bed.  We will try this for 30 days to see if it works well and if not, return to the 30 mg dose.  3. Need for shingles vaccine Shingrix No. 1 given in office today.  Next dose due in 2-6 months. - Zoster Recombinant (Shingrix )  Return in about 6 months (around 02/02/2024) for chronic disease follow up.   Christen Butter, NP

## 2023-08-03 ENCOUNTER — Other Ambulatory Visit (HOSPITAL_COMMUNITY): Payer: Self-pay

## 2023-08-04 ENCOUNTER — Other Ambulatory Visit (HOSPITAL_COMMUNITY): Payer: Self-pay

## 2023-08-27 ENCOUNTER — Encounter (HOSPITAL_COMMUNITY): Payer: Self-pay

## 2023-08-31 ENCOUNTER — Ambulatory Visit: Payer: Commercial Managed Care - PPO | Attending: Medical-Surgical | Admitting: Pharmacist

## 2023-08-31 DIAGNOSIS — Z79899 Other long term (current) drug therapy: Secondary | ICD-10-CM

## 2023-08-31 NOTE — Progress Notes (Signed)
   S: Patient presents today for review of their specialty medication.   Patient is currently taking Tremfya for plaque psoriasis. Patient is managed by Elpidio Anis for this.   Dosing: Plaque psoriasis: SubQ: 100 mg at weeks 0, 4, and then every 8 weeks thereafter.  Adherence: confirms.  Efficacy: reports that this medication works very well for her!  Monitoring:  S/sx of infection: none S/sx of hypersensitivity: none  Current adverse effects: none reported    O:     Lab Results  Component Value Date   WBC 10.9 (H) 05/17/2023   HGB 14.2 05/17/2023   HCT 41.8 05/17/2023   MCV 87.8 05/17/2023   PLT 361 05/17/2023      Chemistry      Component Value Date/Time   NA 140 05/17/2023 1357   K 4.3 05/17/2023 1357   CL 104 05/17/2023 1357   CO2 26 05/17/2023 1357   BUN 13 05/17/2023 1357   CREATININE 0.66 05/17/2023 1357      Component Value Date/Time   CALCIUM 8.8 05/17/2023 1357   ALKPHOS 122 (H) 01/04/2018 1318   AST 14 05/17/2023 1357   ALT 13 05/17/2023 1357   BILITOT 0.7 05/17/2023 1357       A/P: 1. Medication review: patient currently on Tremfya for plaque psoriasis. Reviewed the medication with the patient, including the following: Tremfya is a medication used in the treatment of plaque psoriasis. Administer SubQ into front of thighs, lower abdomen (except for 2 inches around navel), or back of upper arms; do not inject into areas where the skin is tender, bruised, red, hard, thick, scaly, or affected by psoriasis. Possible adverse reactions include increased risk of infection, headache, and hypersensitivity reactions. Live vaccinations should be avoided. No recommendations for any changes.  Butch Penny, PharmD, P[place]y [place], CPP Clinical Pharmacist [facility] [phone].

## 2023-09-08 ENCOUNTER — Other Ambulatory Visit: Payer: Self-pay | Admitting: Medical-Surgical

## 2023-09-09 ENCOUNTER — Other Ambulatory Visit (HOSPITAL_COMMUNITY): Payer: Self-pay | Admitting: Pharmacy Technician

## 2023-09-09 ENCOUNTER — Other Ambulatory Visit (HOSPITAL_COMMUNITY): Payer: Self-pay

## 2023-09-09 MED ORDER — TEMAZEPAM 22.5 MG PO CAPS
22.5000 mg | ORAL_CAPSULE | Freq: Every evening | ORAL | 3 refills | Status: DC | PRN
  Filled 2023-09-09: qty 30, 30d supply, fill #0
  Filled 2023-10-31: qty 30, 30d supply, fill #1
  Filled 2023-12-20: qty 30, 30d supply, fill #2

## 2023-09-09 NOTE — Progress Notes (Signed)
Specialty Pharmacy Refill Coordination Note  Leslie Wiggins is a 52 y.o. female contacted today regarding refills of specialty medication(s) Guselkumab   Patient requested Delivery   Delivery date: 09/14/23   Verified address: [address] [name], [place]   Medication will be filled on 09/13/23.

## 2023-09-12 ENCOUNTER — Other Ambulatory Visit (HOSPITAL_COMMUNITY): Payer: Self-pay

## 2023-09-13 ENCOUNTER — Other Ambulatory Visit: Payer: Self-pay

## 2023-09-16 ENCOUNTER — Other Ambulatory Visit (HOSPITAL_COMMUNITY): Payer: Self-pay

## 2023-10-26 ENCOUNTER — Other Ambulatory Visit: Payer: Self-pay

## 2023-10-31 ENCOUNTER — Other Ambulatory Visit (HOSPITAL_COMMUNITY): Payer: Self-pay

## 2023-11-01 ENCOUNTER — Encounter (HOSPITAL_COMMUNITY): Payer: Self-pay

## 2023-11-01 ENCOUNTER — Other Ambulatory Visit: Payer: Self-pay

## 2023-11-01 ENCOUNTER — Other Ambulatory Visit (HOSPITAL_COMMUNITY): Payer: Self-pay

## 2023-11-01 NOTE — Progress Notes (Signed)
Specialty Pharmacy Ongoing Clinical Assessment Note  Leslie Wiggins is a 52 y.o. female who is being followed by the specialty pharmacy service for RxSp Psoriasis   Patient's specialty medication(s) reviewed today: Guselkumab   Missed doses in the last 4 weeks: 0   Patient/Caregiver did not have any additional questions or concerns.   Therapeutic benefit summary: Patient is achieving benefit   Adverse events/side effects summary: No adverse events/side effects   Patient's therapy is appropriate to: Continue    Goals Addressed             This Visit's Progress    Minimize recurrence of flares       Patient is on track. Patient will maintain adherence         Follow up:  6 months  Bobette Mo Specialty Pharmacist

## 2023-11-01 NOTE — Progress Notes (Signed)
Specialty Pharmacy Refill Coordination Note  Leslie Wiggins is a 52 y.o. female contacted today regarding refills of specialty medication(s) Guselkumab   Patient requested Delivery   Delivery date: 11/08/23   Verified address: [address] [place] [address]   Medication will be filled on 11/07/23.

## 2023-11-07 ENCOUNTER — Other Ambulatory Visit (HOSPITAL_COMMUNITY): Payer: Self-pay

## 2023-11-07 ENCOUNTER — Other Ambulatory Visit: Payer: Self-pay

## 2023-11-08 ENCOUNTER — Other Ambulatory Visit (HOSPITAL_COMMUNITY): Payer: Self-pay

## 2023-11-08 ENCOUNTER — Other Ambulatory Visit: Payer: Self-pay

## 2023-11-08 DIAGNOSIS — Z5181 Encounter for therapeutic drug level monitoring: Secondary | ICD-10-CM | POA: Diagnosis not present

## 2023-11-08 DIAGNOSIS — L403 Pustulosis palmaris et plantaris: Secondary | ICD-10-CM | POA: Diagnosis not present

## 2023-11-08 MED ORDER — TREMFYA 100 MG/ML ~~LOC~~ SOSY
100.0000 mg | PREFILLED_SYRINGE | SUBCUTANEOUS | 2 refills | Status: AC
Start: 2023-11-08 — End: ?
  Filled 2024-02-23: qty 1, 56d supply, fill #0

## 2023-12-20 ENCOUNTER — Other Ambulatory Visit: Payer: Self-pay

## 2023-12-20 ENCOUNTER — Other Ambulatory Visit (HOSPITAL_COMMUNITY): Payer: Self-pay

## 2023-12-21 ENCOUNTER — Other Ambulatory Visit (HOSPITAL_COMMUNITY): Payer: Self-pay

## 2023-12-22 ENCOUNTER — Other Ambulatory Visit: Payer: Self-pay

## 2023-12-22 NOTE — Progress Notes (Signed)
Specialty Pharmacy Refill Coordination Note  Leslie Wiggins is a 53 y.o. female contacted today regarding refills of specialty medication(s) Guselkumab [facility])   Patient requested Delivery   Delivery date: 01/03/24   Verified address: [address]   [name] [address]   Medication will be filled on 01/02/24.

## 2024-01-02 ENCOUNTER — Other Ambulatory Visit (HOSPITAL_COMMUNITY): Payer: Self-pay

## 2024-01-02 ENCOUNTER — Other Ambulatory Visit: Payer: Self-pay

## 2024-01-02 NOTE — Progress Notes (Signed)
01/02/24 CMA: Tremfya  Left voicemail for patient to call Specialty Pharmacy. Tremfya cc declined. Patient may need to re-enroll. Patient to call [phone].

## 2024-01-02 NOTE — Progress Notes (Signed)
Patient contacted the Speciality pharmacy and patient was notified to contact the Tremfya savings program at [phone] for re-enrollment.

## 2024-01-03 ENCOUNTER — Other Ambulatory Visit: Payer: Self-pay

## 2024-01-03 ENCOUNTER — Other Ambulatory Visit (HOSPITAL_COMMUNITY): Payer: Self-pay

## 2024-01-04 ENCOUNTER — Other Ambulatory Visit: Payer: Self-pay

## 2024-01-17 ENCOUNTER — Other Ambulatory Visit: Payer: Self-pay | Admitting: Medical-Surgical

## 2024-01-17 DIAGNOSIS — Z1231 Encounter for screening mammogram for malignant neoplasm of breast: Secondary | ICD-10-CM

## 2024-01-31 ENCOUNTER — Ambulatory Visit: Payer: Commercial Managed Care - PPO | Admitting: Medical-Surgical

## 2024-01-31 ENCOUNTER — Other Ambulatory Visit (HOSPITAL_COMMUNITY): Payer: Self-pay

## 2024-01-31 ENCOUNTER — Encounter: Payer: Self-pay | Admitting: Medical-Surgical

## 2024-01-31 ENCOUNTER — Other Ambulatory Visit: Payer: Self-pay

## 2024-01-31 VITALS — BP 117/76 | HR 93 | Resp 20 | Ht 66.0 in | Wt 271.8 lb

## 2024-01-31 DIAGNOSIS — I10 Essential (primary) hypertension: Secondary | ICD-10-CM | POA: Diagnosis not present

## 2024-01-31 DIAGNOSIS — S60459A Superficial foreign body of unspecified finger, initial encounter: Secondary | ICD-10-CM | POA: Insufficient documentation

## 2024-01-31 DIAGNOSIS — Z23 Encounter for immunization: Secondary | ICD-10-CM | POA: Diagnosis not present

## 2024-01-31 DIAGNOSIS — M62838 Other muscle spasm: Secondary | ICD-10-CM | POA: Diagnosis not present

## 2024-01-31 DIAGNOSIS — G4726 Circadian rhythm sleep disorder, shift work type: Secondary | ICD-10-CM

## 2024-01-31 MED ORDER — ALBUTEROL SULFATE HFA 108 (90 BASE) MCG/ACT IN AERS
2.0000 | INHALATION_SPRAY | Freq: Four times a day (QID) | RESPIRATORY_TRACT | 11 refills | Status: AC | PRN
  Filled 2024-01-31 – 2024-08-01 (×3): qty 6.7, 25d supply, fill #0

## 2024-01-31 MED ORDER — LOSARTAN POTASSIUM 100 MG PO TABS
100.0000 mg | ORAL_TABLET | Freq: Every day | ORAL | 3 refills | Status: AC
  Filled 2024-01-31 – 2024-02-16 (×2): qty 90, 90d supply, fill #0
  Filled 2024-05-07: qty 90, 90d supply, fill #1
  Filled 2024-08-02: qty 90, 90d supply, fill #2
  Filled 2024-12-25: qty 90, 90d supply, fill #3

## 2024-01-31 MED ORDER — TEMAZEPAM 22.5 MG PO CAPS
22.5000 mg | ORAL_CAPSULE | Freq: Every evening | ORAL | 3 refills | Status: DC | PRN
Start: 2024-01-31 — End: 2024-09-05
  Filled 2024-01-31 – 2024-03-19 (×2): qty 30, 30d supply, fill #0
  Filled 2024-05-07: qty 30, 30d supply, fill #1
  Filled 2024-07-12: qty 30, 30d supply, fill #2

## 2024-01-31 MED ORDER — METHOCARBAMOL 500 MG PO TABS
500.0000 mg | ORAL_TABLET | Freq: Every day | ORAL | 3 refills | Status: AC | PRN
  Filled 2024-01-31 – 2024-02-16 (×2): qty 90, 90d supply, fill #0
  Filled 2024-09-25: qty 90, 90d supply, fill #1
  Filled 2024-10-30 – 2024-12-25 (×2): qty 90, 90d supply, fill #2

## 2024-01-31 NOTE — Progress Notes (Unsigned)
 Subjective:  Patient ID: Leslie Wiggins, female    DOB: 1971-04-26, 53 y.o.   MRN: [id number]  Patient Care Team: Christen Butter, NP as PCP - General (Nurse Practitioner)   Chief Complaint:  Medical Management of Chronic Issues (SPLINTER RIGHT INDEX FINGER)   HPI:  Leslie Wiggins is a 53 y.o. female presenting on 01/31/2024 for Medical Management of Chronic Issues (SPLINTER RIGHT INDEX FINGER)   History, Exam,  Impression and Plan  1. Need for shingles vaccine  First shingles vaccine was 09/27/2023 and patient is on schedule for second vaccine today. Pt consents to second vaccine    2. Finger, superficial foreign body (splinter), initial encounter Patient got splinter off of cast iron pan this a.m.  Embedded into right distal lateral finger next to fingernail.  Patient was unable to remove all of splinter.  States it "crumbles" when she attempted to pull/pick it out.  Patient is current on Tdap as of 03/06/2020.  Excised small metallic splinter with 20-gauge needle after thoroughly cleaning the site with isopropyl alcohol. No bleeding.  Band-Aid applied and covered site.  Patient to keep clean and change Band-Aid as needed.  3. Shift work sleep disorder Patient presents for follow-up to changes in Restoril sleep medication dose regimen 4 weeks ago. 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 Uses Restoril once or twice a week. Patient states Restoril is working well without side effects.  Patient states that 22.5 mg Restoril does not work as well as the 30 mg Restoril but "I can deal with it"  but would like to increase dose. Increase Restoril to 30mg  for shiftwork sleep disorder.  - temazepam (RESTORIL) 22.5 MG capsule; Take 1 capsule (22.5 mg total) by mouth at bedtime as needed for sleep.  Dispense: 30 capsule; Refill: 3  4. Primary hypertension Patient reports that she takes blood pressure medicines as directed denies any side effects.  Denies chest pain, arm  pain, lower extremity edema, palpitations, or blurry vision.  Patient lungs are clear heart rate is regular without murmur and negative lower extremity edema noted.   No changes to hypertension with medical condition regimen at this time. Continue and refill losartan.  - losartan (COZAAR) 100 MG tablet; Take 1 tablet by mouth daily.  Dispense: 90 tablet; Refill: 3  5.  Muscle spasm Reports significant back spasm and pain that is well-managed with methocarbamol.  Patient denies any side effects or complications from medication but states " it really helps my back" and she uses it sparingly after work shifts because of " terrible chairs" and physically moving patients.  Denies excessive drowsiness with as needed use.  Advised to avoid taking this with Restoril to prevent oversedation.  Continue and refill methocarbamol -Refill and continue methocarbamol (ROBAXIN) 500 MG tablet; Take 1 tablet (500 mg total) by mouth daily as needed for muscle spasms.  Dispense: 90 tablet; Refill: 3    Continue all other maintenance medications.  Follow up plan: Return in about 6 months (around 07/30/2024) for chronic disease follow up.   Relevant past medical, surgical, family, and social history reviewed and updated as indicated.  Allergies and medications reviewed and updated. Data reviewed: Chart in Epic.   Past Medical History:  Diagnosis Date   Anxiety    Depression    Emphysema of lung (HCC)    Hay fever    Hypertension    Psoriasis     Past Surgical History:  Procedure Laterality Date  WISDOM TOOTH EXTRACTION  1988   WISDOM TOOTH EXTRACTION      Social History   Socioeconomic History   Marital status: Single    Spouse name: Not on file   Number of children: Not on file   Years of education: Not on file   Highest education level: Not on file  Occupational History   Occupation: Charity fundraiser    Employer: [employer]  Tobacco Use   Smoking status: Former    Current packs/day: 0.00    Average  packs/day: 0.5 packs/day for 30.0 years (15.0 ttl pk-yrs)    Types: Cigarettes    Start date: 11/05/1980    Quit date: 11/05/2010    Years since quitting: 13.2   Smokeless tobacco: Never  Vaping Use   Vaping status: Never Used  Substance and Sexual Activity   Alcohol use: Yes    Comment: rarely   Drug use: Yes    Types: Marijuana    Comment: sometimes   Sexual activity: Not Currently    Partners: Female    Birth control/protection: Abstinence, Post-menopausal  Other Topics Concern   Not on file  Social History Narrative   Not on file   Social Drivers of Health   Financial Resource Strain: Not on file  Food Insecurity: No Food Insecurity (07/09/2021)   Received from [facility], [facility]   Hunger Vital Sign    Worried About Running Out of Food in the Last Year: Never true    Ran Out of Food in the Last Year: Never true  Transportation Needs: Not on file  Physical Activity: Not on file  Stress: Not on file  Social Connections: Unknown (04/19/2022)   Received from [facility], [facility]   Social Network    Social Network: Not on file  Intimate Partner Violence: Unknown (03/11/2022)   Received from [facility], [facility]   HITS    Physically Hurt: Not on file    Insult or Talk Down To: Not on file    Threaten Physical Harm: Not on file    Scream or Curse: Not on file    Outpatient Encounter Medications as of 01/31/2024  Medication Sig   clobetasol ointment (TEMOVATE) 0.05 % Apply to affected areas twice a day for 14 days.   guselkumab (TREMFYA) 100 MG/ML prefilled syringe Inject 1 mL (100 mg total) into the skin every 8 (eight) weeks.   guselkumab (TREMFYA) 100 MG/ML prefilled syringe Inject 1 mL (100 mg total) into the skin every 8 (eight) weeks.   [DISCONTINUED] albuterol (VENTOLIN HFA) 108 (90 Base) MCG/ACT inhaler Inhale 2 puffs into the lungs every 6 (six) hours as needed for wheezing or shortness of breath.   [DISCONTINUED] losartan (COZAAR) 100 MG  tablet Take 1 tablet by mouth daily.   [DISCONTINUED] methocarbamol (ROBAXIN) 500 MG tablet Take 1 tablet (500 mg total) by mouth daily as needed for muscle spasms.   [DISCONTINUED] temazepam (RESTORIL) 22.5 MG capsule Take 1 capsule (22.5 mg total) by mouth at bedtime as needed for sleep.   albuterol (VENTOLIN HFA) 108 (90 Base) MCG/ACT inhaler Inhale 2 puffs into the lungs every 6 (six) hours as needed for wheezing or shortness of breath.   losartan (COZAAR) 100 MG tablet Take 1 tablet by mouth daily.   methocarbamol (ROBAXIN) 500 MG tablet Take 1 tablet (500 mg total) by mouth daily as needed for muscle spasms.   temazepam (RESTORIL) 22.5 MG capsule Take 1 capsule (22.5 mg total) by mouth at bedtime as  needed for sleep.   [DISCONTINUED] meloxicam (MOBIC) 15 MG tablet Take 1 tablet by mouth once daily   No facility-administered encounter medications on file as of 01/31/2024.    Allergies  Allergen Reactions   Cyclobenzaprine     Other Reaction(s): rebound spasms   Gabapentin     Other Reaction(s): auditory hallucinations   Pollen Extract    Pregabalin     Other Reaction(s): akathesia   Quetiapine     Other Reaction(s): restless legs   Wound Dressing Adhesive Dermatitis   Lisinopril     Other reaction(s): Cough   Tape Dermatitis, Itching and Rash    Review of Systems      Objective:  BP 117/76 (BP Location: Left Arm, Cuff Size: Normal)   Pulse 93   Resp 20   Ht 5\' 6"  (1.676 m)   Wt 271 lb 12.8 oz (123.3 kg)   LMP 12/09/2021   SpO2 95%   BMI 43.87 kg/m    Wt Readings from Last 3 Encounters:  01/31/24 271 lb 12.8 oz (123.3 kg)  08/02/23 271 lb 8 oz (123.2 kg)  05/17/23 272 lb (123.4 kg)    Physical Exam  Results for orders placed or performed in visit on 05/17/23  CBC with Differential/Platelet   Collection Time: 05/17/23  1:57 PM  Result Value Ref Range   WBC 10.9 (H) 3.8 - 10.8 Thousand/uL   RBC 4.76 3.80 - 5.10 Million/uL   Hemoglobin 14.2 11.7 - 15.5 g/dL    HCT 44.0 34.7 - 42.5 %   MCV 87.8 80.0 - 100.0 fL   MCH 29.8 27.0 - 33.0 pg   MCHC 34.0 32.0 - 36.0 g/dL   RDW 95.6 38.7 - 56.4 %   Platelets 361 140 - 400 Thousand/uL   MPV 9.6 7.5 - 12.5 fL   Neutro Abs 6,551 1,500 - 7,800 cells/uL   Lymphs Abs 3,597 850 - 3,900 cells/uL   Absolute Monocytes 578 200 - 950 cells/uL   Eosinophils Absolute 109 15 - 500 cells/uL   Basophils Absolute 65 0 - 200 cells/uL   Neutrophils Relative % 60.1 %   Total Lymphocyte 33.0 %   Monocytes Relative 5.3 %   Eosinophils Relative 1.0 %   Basophils Relative 0.6 %  COMPLETE METABOLIC PANEL WITH GFR   Collection Time: 05/17/23  1:57 PM  Result Value Ref Range   Glucose, Bld 88 65 - 99 mg/dL   BUN 13 7 - 25 mg/dL   Creat 3.32 9.51 - 8.84 mg/dL   eGFR 166 > OR = 60 AY/TKZ/6.01U9   BUN/Creatinine Ratio SEE NOTE: 6 - 22 (calc)   Sodium 140 135 - 146 mmol/L   Potassium 4.3 3.5 - 5.3 mmol/L   Chloride 104 98 - 110 mmol/L   CO2 26 20 - 32 mmol/L   Calcium 8.8 8.6 - 10.4 mg/dL   Total Protein 6.6 6.1 - 8.1 g/dL   Albumin 3.9 3.6 - 5.1 g/dL   Globulin 2.7 1.9 - 3.7 g/dL (calc)   AG Ratio 1.4 1.0 - 2.5 (calc)   Total Bilirubin 0.7 0.2 - 1.2 mg/dL   Alkaline phosphatase (APISO) 131 37 - 153 U/L   AST 14 10 - 35 U/L   ALT 13 6 - 29 U/L  Lipid panel   Collection Time: 05/17/23  1:57 PM  Result Value Ref Range   Cholesterol 171 <200 mg/dL   HDL 58 > OR = 50 mg/dL   Triglycerides 323 (H) <150 mg/dL  LDL Cholesterol (Calc) 88 mg/dL (calc)   Total CHOL/HDL Ratio 2.9 <5.0 (calc)   Non-HDL Cholesterol (Calc) 113 <130 mg/dL (calc)  TSH   Collection Time: 05/17/23  1:57 PM  Result Value Ref Range   TSH 0.43 mIU/L  Hemoglobin A1c   Collection Time: 05/17/23  1:57 PM  Result Value Ref Range   Hgb A1c MFr Bld 5.6 <5.7 % of total Hgb   Mean Plasma Glucose 114 mg/dL   eAG (mmol/L) 6.3 mmol/L       Pertinent labs & imaging results that were available during my care of the patient were reviewed by me and  considered in my medical decision making.   Continue healthy lifestyle choices, including diet (rich in fruits, vegetables, and lean proteins, and low in salt and simple carbohydrates) and exercise (at least 30 minutes of moderate physical activity daily).   The above assessment and management plan was discussed with the patient. The patient verbalized understanding of and has agreed to the management plan. Patient is aware to call the clinic if they develop any new symptoms or if symptoms persist or worsen. Patient is aware when to return to the clinic for a follow-up visit. Patient educated on when it is appropriate to go to the emergency department.   Maryelizabeth Kaufmann Student AGNP

## 2024-02-01 ENCOUNTER — Encounter: Payer: Self-pay | Admitting: Medical-Surgical

## 2024-02-01 ENCOUNTER — Other Ambulatory Visit (HOSPITAL_COMMUNITY): Payer: Self-pay

## 2024-02-01 NOTE — Progress Notes (Signed)
 Medical screening examination/treatment was performed by qualified clinical staff member and as supervising provider I was immediately available for consultation/collaboration. I have reviewed documentation and agree with assessment and plan.  Thayer Ohm, DNP, APRN, FNP-BC [facility]

## 2024-02-08 ENCOUNTER — Ambulatory Visit: Payer: Commercial Managed Care - PPO

## 2024-02-08 DIAGNOSIS — Z1231 Encounter for screening mammogram for malignant neoplasm of breast: Secondary | ICD-10-CM

## 2024-02-13 ENCOUNTER — Other Ambulatory Visit (HOSPITAL_COMMUNITY): Payer: Self-pay

## 2024-02-13 ENCOUNTER — Encounter: Payer: Self-pay | Admitting: Medical-Surgical

## 2024-02-16 ENCOUNTER — Other Ambulatory Visit (HOSPITAL_COMMUNITY): Payer: Self-pay

## 2024-02-23 ENCOUNTER — Other Ambulatory Visit: Payer: Self-pay

## 2024-02-23 ENCOUNTER — Other Ambulatory Visit (HOSPITAL_COMMUNITY): Payer: Self-pay

## 2024-02-23 ENCOUNTER — Other Ambulatory Visit: Payer: Self-pay | Admitting: Pharmacist

## 2024-02-23 NOTE — Progress Notes (Signed)
 Specialty Pharmacy Refill Coordination Note  Leslie Wiggins is a 52 y.o. female contacted today regarding refills of specialty medication(s) Guselkumab [facility])   Patient requested (Patient-Rptd) Delivery   Delivery date: (Patient-Rptd) 02/28/24   Verified address: (Patient-Rptd) [address] [address]   Medication will be filled on 03.24.25.

## 2024-02-24 ENCOUNTER — Other Ambulatory Visit (HOSPITAL_COMMUNITY): Payer: Self-pay

## 2024-02-24 ENCOUNTER — Other Ambulatory Visit: Payer: Self-pay

## 2024-02-24 ENCOUNTER — Other Ambulatory Visit: Payer: Self-pay | Admitting: Pharmacist

## 2024-02-24 MED ORDER — TREMFYA 100 MG/ML ~~LOC~~ SOSY
100.0000 mg | PREFILLED_SYRINGE | SUBCUTANEOUS | 2 refills | Status: DC
Start: 2024-02-24 — End: 2024-08-22
  Filled 2024-02-24 – 2024-02-27 (×2): qty 1, 56d supply, fill #0
  Filled 2024-04-24: qty 1, 56d supply, fill #1
  Filled 2024-06-20: qty 1, 56d supply, fill #2

## 2024-02-27 ENCOUNTER — Other Ambulatory Visit (HOSPITAL_COMMUNITY): Payer: Self-pay

## 2024-02-27 ENCOUNTER — Other Ambulatory Visit: Payer: Self-pay

## 2024-03-02 ENCOUNTER — Other Ambulatory Visit: Payer: Self-pay

## 2024-03-12 ENCOUNTER — Other Ambulatory Visit: Payer: Self-pay

## 2024-03-19 ENCOUNTER — Other Ambulatory Visit (HOSPITAL_COMMUNITY): Payer: Self-pay

## 2024-03-20 ENCOUNTER — Other Ambulatory Visit (HOSPITAL_COMMUNITY): Payer: Self-pay

## 2024-03-21 ENCOUNTER — Other Ambulatory Visit (HOSPITAL_COMMUNITY): Payer: Self-pay

## 2024-04-24 ENCOUNTER — Other Ambulatory Visit: Payer: Self-pay

## 2024-04-24 NOTE — Progress Notes (Signed)
 Specialty Pharmacy Refill Coordination Note  Leslie Wiggins is a 53 y.o. female contacted today regarding refills of specialty medication(s) Guselkumab  (Tremfya )   Patient requested Delivery   Delivery date: 04/26/24   Verified address: [address], [place], [address]   Medication will be filled on 04/25/24.

## 2024-04-24 NOTE — Progress Notes (Signed)
 Specialty Pharmacy Ongoing Clinical Assessment Note  Leslie Wiggins is a 53 y.o. female who is being followed by the specialty pharmacy service for RxSp Psoriasis   Patient's specialty medication(s) reviewed today: Guselkumab  (Tremfya )   Missed doses in the last 4 weeks: 0   Patient/Caregiver did not have any additional questions or concerns.   Therapeutic benefit summary: Patient is achieving benefit   Adverse events/side effects summary: No adverse events/side effects   Patient's therapy is appropriate to: Continue    Goals Addressed             This Visit's Progress    Minimize recurrence of flares   On track    Patient is on track. Patient will maintain adherence. Patient reported that her skin looks/feels great and has <1% psoriasis.         Follow up: 6 months  [facility]

## 2024-04-25 ENCOUNTER — Other Ambulatory Visit: Payer: Self-pay

## 2024-04-25 NOTE — Progress Notes (Signed)
PA submitted and pt notified

## 2024-04-25 NOTE — Progress Notes (Signed)
 Pharmacy Patient Advocate Encounter   Received notification from Patient Pharmacy that prior authorization for Tremfya  is required/requested.   Insurance verification completed.   The patient is insured through [facility] .   Per test claim: PA required; PA submitted to above mentioned insurance via CoverMyMeds Key/confirmation #/[facility] Status is pending

## 2024-04-26 ENCOUNTER — Other Ambulatory Visit: Payer: Self-pay

## 2024-04-26 ENCOUNTER — Other Ambulatory Visit (HOSPITAL_COMMUNITY): Payer: Self-pay

## 2024-04-26 NOTE — Progress Notes (Signed)
 PA approved. Pt updated.

## 2024-04-26 NOTE — Progress Notes (Signed)
 Pharmacy Patient Advocate Encounter  Received notification from [facility] that Prior Authorization for Tremfya  has been APPROVED from 04/26/24 to 04/25/25   PA #/Case ID/Reference #: [id number]

## 2024-05-07 ENCOUNTER — Other Ambulatory Visit (HOSPITAL_COMMUNITY): Payer: Self-pay

## 2024-05-08 ENCOUNTER — Other Ambulatory Visit: Payer: Self-pay

## 2024-05-08 ENCOUNTER — Other Ambulatory Visit (HOSPITAL_COMMUNITY): Payer: Self-pay

## 2024-06-18 ENCOUNTER — Other Ambulatory Visit (HOSPITAL_COMMUNITY): Payer: Self-pay

## 2024-06-19 ENCOUNTER — Encounter (INDEPENDENT_AMBULATORY_CARE_PROVIDER_SITE_OTHER): Payer: Self-pay

## 2024-06-20 ENCOUNTER — Other Ambulatory Visit: Payer: Self-pay

## 2024-06-20 NOTE — Progress Notes (Signed)
 Specialty Pharmacy Refill Coordination Note  Leslie Wiggins is a 53 y.o. female contacted today regarding refills of specialty medication(s) Guselkumab  (Tremfya )   Patient requested (Patient-Rptd) Delivery   Delivery date: 06/21/24   Verified address: (Patient-Rptd) [address] [place] [address]   Medication will be filled on 06/20/24.

## 2024-07-13 ENCOUNTER — Other Ambulatory Visit: Payer: Self-pay

## 2024-07-31 ENCOUNTER — Ambulatory Visit: Payer: Commercial Managed Care - PPO | Admitting: Medical-Surgical

## 2024-07-31 ENCOUNTER — Other Ambulatory Visit (HOSPITAL_COMMUNITY): Payer: Self-pay

## 2024-08-01 ENCOUNTER — Other Ambulatory Visit: Payer: Self-pay

## 2024-08-01 ENCOUNTER — Other Ambulatory Visit (HOSPITAL_COMMUNITY): Payer: Self-pay

## 2024-08-03 ENCOUNTER — Encounter: Payer: Self-pay | Admitting: Medical-Surgical

## 2024-08-03 ENCOUNTER — Ambulatory Visit (INDEPENDENT_AMBULATORY_CARE_PROVIDER_SITE_OTHER): Admitting: Medical-Surgical

## 2024-08-03 VITALS — BP 114/73 | HR 79 | Resp 20 | Ht 66.0 in | Wt 270.1 lb

## 2024-08-03 DIAGNOSIS — Z111 Encounter for screening for respiratory tuberculosis: Secondary | ICD-10-CM

## 2024-08-03 DIAGNOSIS — J439 Emphysema, unspecified: Secondary | ICD-10-CM | POA: Diagnosis not present

## 2024-08-03 DIAGNOSIS — I10 Essential (primary) hypertension: Secondary | ICD-10-CM | POA: Diagnosis not present

## 2024-08-03 DIAGNOSIS — I700 Atherosclerosis of aorta: Secondary | ICD-10-CM | POA: Diagnosis not present

## 2024-08-03 DIAGNOSIS — F418 Other specified anxiety disorders: Secondary | ICD-10-CM

## 2024-08-03 DIAGNOSIS — G4726 Circadian rhythm sleep disorder, shift work type: Secondary | ICD-10-CM | POA: Diagnosis not present

## 2024-08-03 DIAGNOSIS — Z0000 Encounter for general adult medical examination without abnormal findings: Secondary | ICD-10-CM | POA: Diagnosis not present

## 2024-08-03 NOTE — Patient Instructions (Signed)
 Preventive Care 58-54 Years Old, Female  Preventive care refers to lifestyle choices and visits with your health care provider that can promote health and wellness. Preventive care visits are also called wellness exams.  What can I expect for my preventive care visit?  Counseling  Your health care provider may ask you questions about your:  Medical history, including:  Past medical problems.  Family medical history.  Pregnancy history.  Current health, including:  Menstrual cycle.  Method of birth control.  Emotional well-being.  Home life and relationship well-being.  Sexual activity and sexual health.  Lifestyle, including:  Alcohol, nicotine or tobacco, and drug use.  Access to firearms.  Diet, exercise, and sleep habits.  Work and work Astronomer.  Sunscreen use.  Safety issues such as seatbelt and bike helmet use.  Physical exam  Your health care provider will check your:  Height and weight. These may be used to calculate your BMI (body mass index). BMI is a measurement that tells if you are at a healthy weight.  Waist circumference. This measures the distance around your waistline. This measurement also tells if you are at a healthy weight and may help predict your risk of certain diseases, such as type 2 diabetes and high blood pressure.  Heart rate and blood pressure.  Body temperature.  Skin for abnormal spots.  What immunizations do I need?    Vaccines are usually given at various ages, according to a schedule. Your health care provider will recommend vaccines for you based on your age, medical history, and lifestyle or other factors, such as travel or where you work.  What tests do I need?  Screening  Your health care provider may recommend screening tests for certain conditions. This may include:  Lipid and cholesterol levels.  Diabetes screening. This is done by checking your blood sugar (glucose) after you have not eaten for a while (fasting).  Pelvic exam and Pap test.  Hepatitis B test.  Hepatitis C  test.  HIV (human immunodeficiency virus) test.  STI (sexually transmitted infection) testing, if you are at risk.  Lung cancer screening.  Colorectal cancer screening.  Mammogram. Talk with your health care provider about when you should start having regular mammograms. This may depend on whether you have a family history of breast cancer.  BRCA-related cancer screening. This may be done if you have a family history of breast, ovarian, tubal, or peritoneal cancers.  Bone density scan. This is done to screen for osteoporosis.  Talk with your health care provider about your test results, treatment options, and if necessary, the need for more tests.  Follow these instructions at home:  Eating and drinking    Eat a diet that includes fresh fruits and vegetables, whole grains, lean protein, and low-fat dairy products.  Take vitamin and mineral supplements as recommended by your health care provider.  Do not drink alcohol if:  Your health care provider tells you not to drink.  You are pregnant, may be pregnant, or are planning to become pregnant.  If you drink alcohol:  Limit how much you have to 0-1 drink a day.  Know how much alcohol is in your drink. In the [place] one drink equals one 12 oz bottle of beer (355 mL), one 5 oz glass of wine (148 mL), or one 1 oz glass of hard liquor (44 mL).  Lifestyle  Brush your teeth every morning and night with fluoride toothpaste. Floss one time each day.  Exercise for at least  30 minutes 5 or more days each week.  Do not use any products that contain nicotine or tobacco. These products include cigarettes, chewing tobacco, and vaping devices, such as e-cigarettes. If you need help quitting, ask your health care provider.  Do not use drugs.  If you are sexually active, practice safe sex. Use a condom or other form of protection to prevent STIs.  If you do not wish to become pregnant, use a form of birth control. If you plan to become pregnant, see your health care provider for a  prepregnancy visit.  Take aspirin only as told by your health care provider. Make sure that you understand how much to take and what form to take. Work with your health care provider to find out whether it is safe and beneficial for you to take aspirin daily.  Find healthy ways to manage stress, such as:  Meditation, yoga, or listening to music.  Journaling.  Talking to a trusted person.  Spending time with friends and family.  Minimize exposure to UV radiation to reduce your risk of skin cancer.  Safety  Always wear your seat belt while driving or riding in a vehicle.  Do not drive:  If you have been drinking alcohol. Do not ride with someone who has been drinking.  When you are tired or distracted.  While texting.  If you have been using any mind-altering substances or drugs.  Wear a helmet and other protective equipment during sports activities.  If you have firearms in your house, make sure you follow all gun safety procedures.  Seek help if you have been physically or sexually abused.  What's next?  Visit your health care provider once a year for an annual wellness visit.  Ask your health care provider how often you should have your eyes and teeth checked.  Stay up to date on all vaccines.  This information is not intended to replace advice given to you by your health care provider. Make sure you discuss any questions you have with your health care provider.  Document Revised: 05/20/2021 Document Reviewed: 05/20/2021  Elsevier Patient Education  2024 [employer].

## 2024-08-03 NOTE — Progress Notes (Signed)
 Complete physical exam  Patient: Leslie Wiggins   DOB: 05-May-1971   53 y.o. Female  MRN: [id number]  Subjective:    Chief Complaint  Patient presents with   Annual Exam    Leslie Wiggins is a 53 y.o. female who presents today for a complete physical exam. She reports consuming a general diet. Swimming once weekly for exercise. She generally feels well. She reports sleeping well. She does not have additional problems to discuss today.    Most recent fall risk assessment:    08/02/2023    1:55 PM  Fall Risk   Number falls in past yr: 0  Injury with Fall? 0  Risk for fall due to : No Fall Risks  Follow up Falls evaluation completed     Most recent depression screenings:    08/03/2024    4:37 PM 08/02/2023    1:55 PM  PHQ 2/9 Scores  PHQ - 2 Score 0 0  PHQ- 9 Score 4     Vision:Within last year and Dental: No current dental problems and Receives regular dental care    Patient Care Team: Willo Mini, NP as PCP - General (Nurse Practitioner)   Outpatient Medications Prior to Visit  Medication Sig   albuterol  (VENTOLIN  HFA) 108 (90 Base) MCG/ACT inhaler Inhale 2 puffs into the lungs every 6 (six) hours as needed for wheezing or shortness of breath.   clobetasol  ointment (TEMOVATE ) 0.05 % Apply to affected areas twice a day for 14 days.   guselkumab  (TREMFYA ) 100 MG/ML prefilled syringe Inject 1 mL (100 mg total) into the skin every 8 (eight) weeks.   guselkumab  (TREMFYA ) 100 MG/ML prefilled syringe Inject 1 mL (100 mg total) into the skin every 8 (eight) weeks.   losartan  (COZAAR ) 100 MG tablet Take 1 tablet by mouth daily.   methocarbamol  (ROBAXIN ) 500 MG tablet Take 1 tablet (500 mg total) by mouth daily as needed for muscle spasms.   temazepam  (RESTORIL ) 22.5 MG capsule Take 1 capsule (22.5 mg total) by mouth at bedtime as needed for sleep.   No facility-administered medications prior to visit.    Review of Systems  Constitutional:  Negative for chills, fever,  malaise/fatigue and weight loss.  HENT:  Negative for congestion, ear pain, hearing loss, sinus pain and sore throat.   Eyes:  Negative for blurred vision, photophobia and pain.  Respiratory:  Negative for cough, shortness of breath and wheezing.   Cardiovascular:  Negative for chest pain, palpitations and leg swelling.  Gastrointestinal:  Negative for abdominal pain, constipation, diarrhea, heartburn, nausea and vomiting.  Genitourinary:  Negative for dysuria, frequency and urgency.  Musculoskeletal:  Positive for joint pain (left wrist injury). Negative for falls and neck pain.  Skin:  Negative for itching and rash.  Neurological:  Negative for dizziness, weakness and headaches.  Endo/Heme/Allergies:  Negative for polydipsia. Does not bruise/bleed easily.  Psychiatric/Behavioral:  Negative for depression, substance abuse and suicidal ideas. The patient is not nervous/anxious and does not have insomnia.      Objective:     BP 114/73 (BP Location: Left Arm, Cuff Size: Normal)   Pulse 79   Resp 20   Ht 5' 6 (1.676 m)   Wt 270 lb 1.3 oz (122.5 kg)   LMP 12/09/2021   SpO2 96%   BMI 43.59 kg/m    Physical Exam Vitals reviewed.  Constitutional:      General: She is not in acute distress.    Appearance: Normal appearance. She is  obese. She is not ill-appearing.  HENT:     Head: Normocephalic and atraumatic.     Right Ear: Tympanic membrane, ear canal and external ear normal. There is no impacted cerumen.     Left Ear: Tympanic membrane, ear canal and external ear normal. There is no impacted cerumen.     Nose: Nose normal. No congestion or rhinorrhea.     Mouth/Throat:     Mouth: Mucous membranes are moist.     Pharynx: No oropharyngeal exudate or posterior oropharyngeal erythema.  Eyes:     General: No scleral icterus.       Right eye: No discharge.        Left eye: No discharge.     Extraocular Movements: Extraocular movements intact.     Conjunctiva/sclera: Conjunctivae  normal.     Pupils: Pupils are equal, round, and reactive to light.  Neck:     Thyroid : No thyromegaly.     Vascular: No carotid bruit or JVD.     Trachea: Trachea normal.  Cardiovascular:     Rate and Rhythm: Normal rate and regular rhythm.     Pulses: Normal pulses.     Heart sounds: Normal heart sounds. No murmur heard.    No friction rub. No gallop.  Pulmonary:     Effort: Pulmonary effort is normal. No respiratory distress.     Breath sounds: Normal breath sounds. No wheezing.  Abdominal:     General: Bowel sounds are normal. There is no distension.     Palpations: Abdomen is soft.     Tenderness: There is no abdominal tenderness. There is no guarding.  Musculoskeletal:        General: Normal range of motion.     Cervical back: Normal range of motion and neck supple.  Lymphadenopathy:     Cervical: No cervical adenopathy.  Skin:    General: Skin is warm and dry.  Neurological:     Mental Status: She is alert and oriented to person, place, and time.     Cranial Nerves: No cranial nerve deficit.  Psychiatric:        Mood and Affect: Mood normal.        Behavior: Behavior normal.        Thought Content: Thought content normal.        Judgment: Judgment normal.      No results found for any visits on 08/03/24.     Assessment & Plan:    Routine Health Maintenance and Physical Exam  Immunization History  Administered Date(s) Administered   Influenza-Unspecified 09/05/2021, 09/20/2023   PFIZER(Purple Top)SARS-COV-2 Vaccination 12/14/2019, 01/04/2020, 09/18/2020   Tdap 03/06/2020   Zoster Recombinant(Shingrix ) 08/02/2023, 01/31/2024    Health Maintenance  Topic Date Due   Pneumococcal Vaccine: 50+ Years (1 of 2 - PCV) Never done   Hepatitis B Vaccines 19-59 Average Risk (1 of 3 - 19+ 3-dose series) Never done   COVID-19 Vaccine (4 - 2024-25 season) 08/19/2024 (Originally 08/07/2023)   INFLUENZA VACCINE  03/05/2025 (Originally 07/06/2024)   Hepatitis C Screening   08/03/2025 (Originally 10/[birth year]/1988)   Colonoscopy  01/11/2026   MAMMOGRAM  02/07/2026   Cervical Cancer Screening (HPV/Pap Cotest)  11/17/2027   DTaP/Tdap/Td (2 - Td or Tdap) 03/06/2030   HIV Screening  Completed   Zoster Vaccines- Shingrix   Completed   HPV VACCINES  Aged Out   Meningococcal B Vaccine  Aged Out    Discussed health benefits of physical activity, and encouraged her to engage in  regular exercise appropriate for her age and condition.  1. Annual physical exam (Primary) Checking labs as below. UTD on preventative care. Wellness information provided with AVS. - CBC - CMP14+EGFR - Lipid panel  2. Aortic atherosclerosis (HCC) Checking lipids. - Lipid panel  3. Primary hypertension Blood pressure stable.  Checking labs.  Continue losartan  100 mg daily. - CBC - CMP14+EGFR - Lipid panel  4. Pulmonary emphysema, unspecified emphysema type (HCC) No current respiratory complaints.  Continue albuterol  inhaler as needed.  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  Does not take this on nights that she does not have to worry about work.  Continue temazepam  as prescribed.  6. Situational anxiety Currently doing well without medication.  7. Screening-pulmonary TB QuantiFERON gold ordered today. - QuantiFERON-TB Gold Plus   Return in about 6 months (around 02/02/2025) for chronic disease follow up.     Dinorah Masullo, NP

## 2024-08-07 ENCOUNTER — Encounter: Payer: Self-pay | Admitting: Sports Medicine

## 2024-08-08 DIAGNOSIS — I700 Atherosclerosis of aorta: Secondary | ICD-10-CM | POA: Diagnosis not present

## 2024-08-08 DIAGNOSIS — Z111 Encounter for screening for respiratory tuberculosis: Secondary | ICD-10-CM | POA: Diagnosis not present

## 2024-08-08 DIAGNOSIS — I10 Essential (primary) hypertension: Secondary | ICD-10-CM | POA: Diagnosis not present

## 2024-08-08 DIAGNOSIS — Z0000 Encounter for general adult medical examination without abnormal findings: Secondary | ICD-10-CM | POA: Diagnosis not present

## 2024-08-09 ENCOUNTER — Ambulatory Visit: Payer: Self-pay | Admitting: Medical-Surgical

## 2024-08-10 ENCOUNTER — Other Ambulatory Visit: Payer: Self-pay

## 2024-08-10 ENCOUNTER — Other Ambulatory Visit (HOSPITAL_COMMUNITY): Payer: Self-pay

## 2024-08-10 NOTE — Progress Notes (Signed)
 Specialty Pharmacy Refill Coordination Note  Spoke with Leslie Wiggins is a 53 y.o. female contacted today regarding refills of specialty medication(s) Guselkumab  (Tremfya )  Doses on hand: 0  Injection date: 08/21/24   Patient requested: Delivery   Delivery date: 08/15/24   Verified address: [address] [place] [address]  Medication will be filled on 08/14/24.    This fill date is pending response to refill request from provider. Patient is aware and if they have not received fill by intended date, they must follow up with pharmacy.  RR sent. Send to [facility] for rewrite.

## 2024-08-11 LAB — CMP14+EGFR
ALT: 18 IU/L (ref 0–32)
AST: 17 IU/L (ref 0–40)
Albumin: 4.1 g/dL (ref 3.8–4.9)
Alkaline Phosphatase: 130 IU/L — ABNORMAL HIGH (ref 44–121)
BUN/Creatinine Ratio: 15 (ref 9–23)
BUN: 10 mg/dL (ref 6–24)
Bilirubin Total: 0.6 mg/dL (ref 0.0–1.2)
CO2: 21 mmol/L (ref 20–29)
Calcium: 9.2 mg/dL (ref 8.7–10.2)
Chloride: 104 mmol/L (ref 96–106)
Creatinine, Ser: 0.68 mg/dL (ref 0.57–1.00)
Globulin, Total: 2.7 g/dL (ref 1.5–4.5)
Glucose: 94 mg/dL (ref 70–99)
Potassium: 4.6 mmol/L (ref 3.5–5.2)
Sodium: 139 mmol/L (ref 134–144)
Total Protein: 6.8 g/dL (ref 6.0–8.5)
eGFR: 104 mL/min/1.73 (ref >59)

## 2024-08-11 LAB — CBC
Hematocrit: 45.2 % (ref 34.0–46.6)
Hemoglobin: 14.9 g/dL (ref 11.1–15.9)
MCH: 30 pg (ref 26.6–33.0)
MCHC: 33 g/dL (ref 31.5–35.7)
MCV: 91 fL (ref 79–97)
Platelets: 341 x10E3/uL (ref 150–450)
RBC: 4.96 x10E6/uL (ref 3.77–5.28)
RDW: 12.4 % (ref 11.7–15.4)
WBC: 11.2 x10E3/uL — ABNORMAL HIGH (ref 3.4–10.8)

## 2024-08-11 LAB — LIPID PANEL
Chol/HDL Ratio: 3.2 ratio (ref 0.0–4.4)
Cholesterol, Total: 169 mg/dL (ref 100–199)
HDL: 53 mg/dL (ref >39)
LDL Chol Calc (NIH): 96 mg/dL (ref 0–99)
Triglycerides: 113 mg/dL (ref 0–149)
VLDL Cholesterol Cal: 20 mg/dL (ref 5–40)

## 2024-08-11 LAB — QUANTIFERON-TB GOLD PLUS
QuantiFERON Mitogen Value: >10 [IU]/mL
QuantiFERON Nil Value: 0.02 [IU]/mL
QuantiFERON TB1 Ag Value: 0.03 [IU]/mL
QuantiFERON TB2 Ag Value: 0.03 [IU]/mL
QuantiFERON-TB Gold Plus: NEGATIVE

## 2024-08-13 ENCOUNTER — Other Ambulatory Visit (HOSPITAL_COMMUNITY): Payer: Self-pay

## 2024-08-14 ENCOUNTER — Other Ambulatory Visit (HOSPITAL_COMMUNITY): Payer: Self-pay

## 2024-08-15 ENCOUNTER — Other Ambulatory Visit (HOSPITAL_COMMUNITY): Payer: Self-pay

## 2024-08-15 ENCOUNTER — Other Ambulatory Visit: Payer: Self-pay

## 2024-08-15 NOTE — Progress Notes (Addendum)
 9.10: LVM. Still waiting on refill. Patient may need to speak with office to let them know her next injection is due 9.16

## 2024-08-21 ENCOUNTER — Other Ambulatory Visit: Payer: Self-pay

## 2024-08-22 ENCOUNTER — Ambulatory Visit: Attending: Medical-Surgical | Admitting: Pharmacist

## 2024-08-22 ENCOUNTER — Other Ambulatory Visit (HOSPITAL_COMMUNITY): Payer: Self-pay

## 2024-08-22 ENCOUNTER — Other Ambulatory Visit: Payer: Self-pay

## 2024-08-22 DIAGNOSIS — Z7189 Other specified counseling: Secondary | ICD-10-CM

## 2024-08-22 MED ORDER — TREMFYA 100 MG/ML ~~LOC~~ SOSY: PREFILLED_SYRINGE | SUBCUTANEOUS | 0 refills | Status: DC

## 2024-08-22 MED ORDER — TREMFYA 100 MG/ML ~~LOC~~ SOSY
PREFILLED_SYRINGE | SUBCUTANEOUS | 0 refills | Status: DC
  Filled 2024-08-22: qty 1, 56d supply, fill #0

## 2024-08-22 NOTE — Progress Notes (Signed)
   S: Patient presents today for review of their specialty medication.   Patient is currently taking Tremfya  for plaque psoriasis. Patient is managed by Rocky Pereyra for this.   Dosing: Plaque psoriasis: SubQ: 100 mg at weeks 0, 4, and then every 8 weeks thereafter.  Adherence: confirms.  Efficacy: reports that this medication works very well for her!  Monitoring:  S/sx of infection: none S/sx of hypersensitivity: none  Current adverse effects: none reported    O:     Lab Results  Component Value Date   WBC 11.2 (H) 08/08/2024   HGB 14.9 08/08/2024   HCT 45.2 08/08/2024   MCV 91 08/08/2024   PLT 341 08/08/2024      Chemistry      Component Value Date/Time   NA 139 08/08/2024 0951   K 4.6 08/08/2024 0951   CL 104 08/08/2024 0951   CO2 21 08/08/2024 0951   BUN 10 08/08/2024 0951   CREATININE 0.68 08/08/2024 0951   CREATININE 0.66 05/17/2023 1357      Component Value Date/Time   CALCIUM 9.2 08/08/2024 0951   ALKPHOS 130 (H) 08/08/2024 0951   AST 17 08/08/2024 0951   ALT 18 08/08/2024 0951   BILITOT 0.6 08/08/2024 0951       A/P: 1. Medication review: patient currently on Tremfya  for plaque psoriasis. Reviewed the medication with the patient, including the following: Tremfya  is a medication used in the treatment of plaque psoriasis. Administer SubQ into front of thighs, lower abdomen (except for 2 inches around navel), or back of upper arms; do not inject into areas where the skin is tender, bruised, red, hard, thick, scaly, or affected by psoriasis. Possible adverse reactions include increased risk of infection, headache, and hypersensitivity reactions. Live vaccinations should be avoided. No recommendations for any changes.  Herlene Fleeta Morris, PharmD, JAQUELINE, CPP Clinical Pharmacist [facility] [phone].

## 2024-08-23 ENCOUNTER — Other Ambulatory Visit: Payer: Self-pay

## 2024-08-27 ENCOUNTER — Other Ambulatory Visit: Payer: Self-pay

## 2024-08-27 ENCOUNTER — Other Ambulatory Visit: Payer: Self-pay | Admitting: Pharmacist

## 2024-08-27 MED ORDER — TREMFYA 100 MG/ML ~~LOC~~ SOSY: PREFILLED_SYRINGE | SUBCUTANEOUS | 0 refills | Status: DC

## 2024-08-27 MED ORDER — TREMFYA 100 MG/ML ~~LOC~~ SOSY
PREFILLED_SYRINGE | SUBCUTANEOUS | 0 refills | Status: DC
  Filled 2024-08-28: qty 1, fill #0

## 2024-08-28 ENCOUNTER — Other Ambulatory Visit (HOSPITAL_COMMUNITY): Payer: Self-pay

## 2024-08-28 ENCOUNTER — Other Ambulatory Visit: Payer: Self-pay

## 2024-09-05 ENCOUNTER — Other Ambulatory Visit: Payer: Self-pay | Admitting: Medical-Surgical

## 2024-09-05 ENCOUNTER — Other Ambulatory Visit: Payer: Self-pay

## 2024-09-05 ENCOUNTER — Other Ambulatory Visit: Payer: Self-pay | Admitting: Pharmacist

## 2024-09-05 ENCOUNTER — Other Ambulatory Visit (HOSPITAL_COMMUNITY): Payer: Self-pay

## 2024-09-05 DIAGNOSIS — L403 Pustulosis palmaris et plantaris: Secondary | ICD-10-CM | POA: Diagnosis not present

## 2024-09-05 DIAGNOSIS — G4726 Circadian rhythm sleep disorder, shift work type: Secondary | ICD-10-CM

## 2024-09-05 DIAGNOSIS — Z5181 Encounter for therapeutic drug level monitoring: Secondary | ICD-10-CM | POA: Diagnosis not present

## 2024-09-05 MED ORDER — TREMFYA 100 MG/ML ~~LOC~~ SOSY
PREFILLED_SYRINGE | SUBCUTANEOUS | 5 refills | Status: AC
  Filled 2024-09-05: qty 1, fill #0
  Filled 2024-10-11: qty 1, 56d supply, fill #0
  Filled 2024-12-03: qty 1, 56d supply, fill #1

## 2024-09-05 MED ORDER — TREMFYA 100 MG/ML ~~LOC~~ SOSY
PREFILLED_SYRINGE | SUBCUTANEOUS | 5 refills | Status: DC
  Filled 2024-09-05: qty 1, 56d supply, fill #0

## 2024-09-07 ENCOUNTER — Other Ambulatory Visit (HOSPITAL_COMMUNITY): Payer: Self-pay

## 2024-09-07 MED ORDER — TEMAZEPAM 22.5 MG PO CAPS
22.5000 mg | ORAL_CAPSULE | Freq: Every evening | ORAL | 3 refills | Status: AC | PRN
  Filled 2024-09-07: qty 30, 30d supply, fill #0
  Filled 2024-10-30: qty 30, 30d supply, fill #1
  Filled 2024-12-25: qty 30, 30d supply, fill #2

## 2024-09-07 NOTE — Telephone Encounter (Signed)
 Last filled 01/31/2024  Last OV 08/03/2024  Upcoming appointment 01/29/2025

## 2024-09-10 ENCOUNTER — Encounter: Payer: Self-pay | Admitting: Medical-Surgical

## 2024-10-11 ENCOUNTER — Other Ambulatory Visit (HOSPITAL_COMMUNITY): Payer: Self-pay

## 2024-10-11 ENCOUNTER — Encounter (INDEPENDENT_AMBULATORY_CARE_PROVIDER_SITE_OTHER): Payer: Self-pay

## 2024-10-11 ENCOUNTER — Other Ambulatory Visit: Payer: Self-pay | Admitting: Pharmacy Technician

## 2024-10-11 ENCOUNTER — Other Ambulatory Visit: Payer: Self-pay

## 2024-10-11 NOTE — Progress Notes (Signed)
 Specialty Pharmacy Refill Coordination Note  Thanvi Blincoe is a 53 y.o. female contacted today regarding refills of specialty medication(s) Guselkumab  (Tremfya )   Patient requested (Patient-Rptd) Delivery   Delivery date: 10/12/2024 Verified address: (Patient-Rptd) [address] [address]   Medication will be filled on:10/11/2024

## 2024-10-30 ENCOUNTER — Other Ambulatory Visit: Payer: Self-pay

## 2024-10-30 ENCOUNTER — Other Ambulatory Visit (HOSPITAL_COMMUNITY): Payer: Self-pay

## 2024-11-27 ENCOUNTER — Other Ambulatory Visit (HOSPITAL_COMMUNITY): Payer: Self-pay

## 2024-12-03 ENCOUNTER — Other Ambulatory Visit: Payer: Self-pay

## 2024-12-05 ENCOUNTER — Other Ambulatory Visit (HOSPITAL_COMMUNITY): Payer: Self-pay

## 2024-12-05 NOTE — Progress Notes (Signed)
 Specialty Pharmacy Refill Coordination Note  Leslie Wiggins is a 53 y.o. female contacted today regarding refills of specialty medication(s) Guselkumab  (Tremfya )   Patient requested Delivery   Delivery date: 12/11/24   Verified address: [address]  [place]   Medication will be filled on: 12/10/24

## 2024-12-10 ENCOUNTER — Other Ambulatory Visit: Payer: Self-pay

## 2024-12-25 ENCOUNTER — Other Ambulatory Visit: Payer: Self-pay

## 2025-01-03 ENCOUNTER — Other Ambulatory Visit: Payer: Self-pay

## 2025-01-29 ENCOUNTER — Ambulatory Visit: Admitting: Medical-Surgical
